# Patient Record
Sex: Male | Born: 1990
Health system: Southern US, Community
[De-identification: ages and names within clinical notes are randomized; demographics above are authoritative.]

## PROBLEM LIST (undated history)

## (undated) DIAGNOSIS — N200 Calculus of kidney: Secondary | ICD-10-CM

## (undated) DIAGNOSIS — F845 Asperger's syndrome: Secondary | ICD-10-CM

---

## 2010-02-27 ENCOUNTER — Emergency Department (HOSPITAL_BASED_OUTPATIENT_CLINIC_OR_DEPARTMENT_OTHER)
Admission: EM | Admit: 2010-02-27 | Discharge: 2010-02-27 | Payer: Self-pay | Source: Home / Self Care | Admitting: Emergency Medicine

## 2010-09-06 ENCOUNTER — Encounter: Payer: Self-pay | Admitting: *Deleted

## 2010-09-06 ENCOUNTER — Emergency Department (HOSPITAL_BASED_OUTPATIENT_CLINIC_OR_DEPARTMENT_OTHER)
Admission: EM | Admit: 2010-09-06 | Discharge: 2010-09-06 | Disposition: A | Discharge status: Home / Self Care | Payer: Medicaid Other | Attending: Emergency Medicine | Admitting: Emergency Medicine

## 2010-09-06 DIAGNOSIS — L0231 Cutaneous abscess of buttock: Secondary | ICD-10-CM | POA: Insufficient documentation

## 2010-09-06 DIAGNOSIS — L03317 Cellulitis of buttock: Secondary | ICD-10-CM

## 2010-09-06 DIAGNOSIS — R21 Rash and other nonspecific skin eruption: Secondary | ICD-10-CM | POA: Insufficient documentation

## 2010-09-06 MED ORDER — DOXYCYCLINE HYCLATE 100 MG PO CAPS: 100.0000 mg | ORAL_CAPSULE | Freq: Two times a day (BID) | ORAL | Status: AC

## 2010-09-06 NOTE — ED Provider Notes (Signed)
History     Chief Complaint  Patient presents with  . Recurrent Skin Infections   Patient is a 20 y.o. male presenting with rash.  Rash  This is a new problem. The current episode started more than 2 days ago. The problem has been gradually worsening. The problem is associated with nothing. There has been no fever. The rash is present on the right buttock and left buttock. The pain is moderate. The pain has been constant since onset. Associated symptoms include blisters, itching and pain. The treatment provided moderate relief.  Pt complains of pain and rash/sores to buttocks,  Pt complains of swelling and drainage  History reviewed. No pertinent past medical history.  History reviewed. No pertinent past surgical history.  History reviewed. No pertinent family history.  History  Substance Use Topics  . Smoking status: Never Smoker   . Smokeless tobacco: Not on file  . Alcohol Use: No      Review of Systems  Skin: Positive for itching and rash.  All other systems reviewed and are negative.    Physical Exam  BP 136/77  Pulse 76  Temp(Src) 98.2 F (36.8 C) (Oral)  Ht 5\' 9"  (1.753 m)  Wt 160 lb (72.576 kg)  BMI 23.63 kg/m2  SpO2 100%  Physical Exam  Constitutional: He appears well-developed and well-nourished.  HENT:  Head: Normocephalic.  Eyes: Pupils are equal, round, and reactive to light.  Neck: Normal range of motion.  Musculoskeletal: He exhibits tenderness.  Neurological: He is alert.  Skin: Rash noted. There is erythema.  Psychiatric: He has a normal mood and affect.    ED Course  Procedures  MDM Pt has 2 draining pustules bilat buttocks.        Langston Masker, [place] 09/06/10 2026

## 2010-09-06 NOTE — ED Notes (Signed)
C/o boils to lower back area x 1 week

## 2010-09-06 NOTE — ED Notes (Signed)
Pt presents to ED today with recurrent infections to back.  Pt has not seen/does not have PMD.

## 2010-09-11 NOTE — ED Provider Notes (Signed)
Medical screening examination/treatment/procedure(s) were performed by non-physician practitioner and as supervising physician I was immediately available for consultation/collaboration.   Vegas Fritze 09/11/10 0744 

## 2010-10-20 ENCOUNTER — Encounter (HOSPITAL_BASED_OUTPATIENT_CLINIC_OR_DEPARTMENT_OTHER): Payer: Self-pay | Admitting: Emergency Medicine

## 2010-10-20 ENCOUNTER — Emergency Department (HOSPITAL_BASED_OUTPATIENT_CLINIC_OR_DEPARTMENT_OTHER)
Admission: EM | Admit: 2010-10-20 | Discharge: 2010-10-20 | Disposition: A | Discharge status: Home / Self Care | Payer: Medicaid Other | Attending: Emergency Medicine | Admitting: Emergency Medicine

## 2010-10-20 DIAGNOSIS — R21 Rash and other nonspecific skin eruption: Secondary | ICD-10-CM | POA: Insufficient documentation

## 2010-10-20 DIAGNOSIS — L0231 Cutaneous abscess of buttock: Secondary | ICD-10-CM | POA: Insufficient documentation

## 2010-10-20 MED ORDER — DOXYCYCLINE HYCLATE 100 MG PO CAPS: 100.0000 mg | ORAL_CAPSULE | Freq: Two times a day (BID) | ORAL | Status: AC

## 2010-10-20 NOTE — ED Notes (Signed)
MD at bedside.Pt presents with raised red site to sacrum painful to touch

## 2010-10-20 NOTE — ED Provider Notes (Addendum)
History     CSN: [id number] Arrival date & time: 10/20/2010 12:24 PM  Chief Complaint  Patient presents with  . Recurrent Skin Infections    boil above rectum painful   HPI Comments: Patient complains of possible abscess versus cellulitis over his left upper gluteal region. He notes that he has had this before. He's been on doxycycline and it has improved. Patient has had no drainage from the wounds. Patient denies any fevers, nausea, vomiting. This is been slowly progressing over the last week. Does not have a primary care physician for followup.  He comes in because of increasing pain at the site and and concern that he needs antibiotics.  The history is provided by the patient. No language interpreter was used.    History reviewed. No pertinent past medical history.  History reviewed. No pertinent past surgical history.  No family history on file.  History  Substance Use Topics  . Smoking status: Never Smoker   . Smokeless tobacco: Not on file  . Alcohol Use: No      Review of Systems  Constitutional: Negative.  Negative for fever and chills.  HENT: Negative.   Eyes: Negative.  Negative for discharge and redness.  Respiratory: Negative.  Negative for cough and shortness of breath.   Cardiovascular: Negative.  Negative for chest pain.  Gastrointestinal: Negative.  Negative for nausea, vomiting and abdominal pain.  Genitourinary: Negative.  Negative for hematuria.  Musculoskeletal: Negative.  Negative for back pain.  Skin: Positive for color change and rash.  Neurological: Negative for syncope and headaches.  Hematological: Negative.  Negative for adenopathy.  Psychiatric/Behavioral: Negative.  Negative for confusion.  All other systems reviewed and are negative.    Physical Exam  BP 154/69  Pulse 69  Temp(Src) 98.1 F (36.7 C) (Oral)  Resp 12  Ht 5\' 9"  (1.753 m)  Wt 160 lb (72.576 kg)  BMI 23.63 kg/m2  SpO2 99%  Physical Exam  Constitutional: He is oriented  to person, place, and time. He appears well-developed and well-nourished. No distress.  HENT:  Head: Normocephalic and atraumatic.  Eyes: Conjunctivae and EOM are normal. Pupils are equal, round, and reactive to light.  Neck: Normal range of motion.  Pulmonary/Chest: Effort normal.  Abdominal: Soft.  Musculoskeletal: Normal range of motion.  Neurological: He is alert and oriented to person, place, and time.  Skin: Skin is warm and dry. Rash noted. He is not diaphoretic. There is erythema.       Over patient's left upper gluteal region he has an area of mild erythema and mild warmth. There is no fluctuance noted over the area. There are multiple dark scars noted from prior wounds. Minimal induration is noted over the site. The area is approximately 5-6 cm in diameter  Psychiatric: His behavior is normal. Judgment and thought content normal.    ED Course  Procedures  MDM As the patient has no fluctuance noted over the area I do not believe an incision and drainage is needed at this time. He does appear to have had a cellulitis to the site will place him back on doxycycline for antibiotic treatment. I will give him some clinic information for primary care followup as well. He knows to return if the symptoms are worsening including increasing redness swelling or fevers.   Date: 10/20/2010  Rate: 67  Rhythm: normal sinus rhythm  QRS Axis: normal  Intervals: normal  ST/T Wave abnormalities: nonspecific T wave changes  Conduction Disutrbances:none  Narrative Interpretation: Normal  EKG with no signs of acute ischemia  Old EKG Reviewed: unchanged from 09/24/2007      Nat Christen, MD 10/20/10 1245  Nat Christen, MD 10/20/10 1250

## 2010-10-20 NOTE — ED Notes (Signed)
Pt verbalizes benefits of antibiotics and follow up as needed

## 2010-10-20 NOTE — ED Notes (Signed)
Pt presents with raised red site above rectum

## 2011-07-12 ENCOUNTER — Emergency Department (HOSPITAL_BASED_OUTPATIENT_CLINIC_OR_DEPARTMENT_OTHER): Payer: Medicaid Other

## 2011-07-12 ENCOUNTER — Encounter (HOSPITAL_BASED_OUTPATIENT_CLINIC_OR_DEPARTMENT_OTHER): Payer: Self-pay | Admitting: *Deleted

## 2011-07-12 ENCOUNTER — Emergency Department (HOSPITAL_BASED_OUTPATIENT_CLINIC_OR_DEPARTMENT_OTHER)
Admission: EM | Admit: 2011-07-12 | Discharge: 2011-07-12 | Disposition: A | Discharge status: Home / Self Care | Payer: Medicaid Other | Attending: Emergency Medicine | Admitting: Emergency Medicine

## 2011-07-12 DIAGNOSIS — R319 Hematuria, unspecified: Secondary | ICD-10-CM | POA: Insufficient documentation

## 2011-07-12 DIAGNOSIS — N200 Calculus of kidney: Secondary | ICD-10-CM

## 2011-07-12 DIAGNOSIS — N201 Calculus of ureter: Secondary | ICD-10-CM | POA: Insufficient documentation

## 2011-07-12 DIAGNOSIS — F848 Other pervasive developmental disorders: Secondary | ICD-10-CM | POA: Insufficient documentation

## 2011-07-12 DIAGNOSIS — R350 Frequency of micturition: Secondary | ICD-10-CM | POA: Insufficient documentation

## 2011-07-12 HISTORY — DX: Asperger's syndrome: F84.5

## 2011-07-12 LAB — BASIC METABOLIC PANEL
BUN: 11 mg/dL (ref 6–23)
Calcium: 9.6 mg/dL (ref 8.4–10.5)
Creatinine, Ser: 1 mg/dL (ref 0.50–1.35)
GFR calc Af Amer: >90 mL/min (ref >90)
GFR calc non Af Amer: >90 mL/min (ref >90)
Glucose, Bld: 85 mg/dL (ref 70–99)
Potassium: 3.9 mEq/L (ref 3.5–5.1)

## 2011-07-12 LAB — CBC
HCT: 37.7 % — ABNORMAL LOW (ref 39.0–52.0)
Hemoglobin: 13.7 g/dL (ref 13.0–17.0)
MCH: 31.9 pg (ref 26.0–34.0)
MCHC: 36.3 g/dL — ABNORMAL HIGH (ref 30.0–36.0)
RDW: 12.4 % (ref 11.5–15.5)

## 2011-07-12 LAB — URINALYSIS, ROUTINE W REFLEX MICROSCOPIC
Bilirubin Urine: NEGATIVE
Glucose, UA: NEGATIVE mg/dL
Protein, ur: NEGATIVE mg/dL
Specific Gravity, Urine: 1.013 (ref 1.005–1.030)
Urobilinogen, UA: 1 mg/dL (ref 0.0–1.0)

## 2011-07-12 LAB — URINE MICROSCOPIC-ADD ON

## 2011-07-12 MED ORDER — TERAZOSIN HCL 1 MG PO CAPS: 1.0000 mg | ORAL_CAPSULE | Freq: Every day | ORAL | Status: AC

## 2011-07-12 MED ORDER — IBUPROFEN 600 MG PO TABS: 600.0000 mg | ORAL_TABLET | Freq: Three times a day (TID) | ORAL | Status: AC | PRN

## 2011-07-12 MED ORDER — HYDROCODONE-ACETAMINOPHEN 5-325 MG PO TABS: 1.0000 | ORAL_TABLET | Freq: Four times a day (QID) | ORAL | Status: AC | PRN

## 2011-07-12 NOTE — ED Provider Notes (Signed)
History     CSN: [id number]  Arrival date & time 07/12/11  1807   First MD Initiated Contact with Patient 07/12/11 1834      No chief complaint on file.   (Consider location/radiation/quality/duration/timing/severity/associated sxs/prior treatment) HPI The patient is a 21 yo man, history of asperger, presenting with hematuria.  The patient was in his usual state of health this morning, when he noticed a pink tinge to his urine.  Later that day, he urinated with no red or pink color to the urine.  He states that this transient color change to his urine has happened twice before, about 4 months ago and 12 months ago, both of which were self-limited.  He notes mild urinary frequency, using the bathroom 3-4 times/day for the last 2 days (which is more than his normal amount), but no dysuria, fevers, vomiting, flank pain, or groin pain.  He is not sexually active.  He takes no outpatient medications.    Past Medical History  Diagnosis Date  . Asperger syndrome     History reviewed. No pertinent past surgical history.  No family history on file.  History  Substance Use Topics  . Smoking status: Never Smoker   . Smokeless tobacco: Not on file  . Alcohol Use: No      Review of Systems General: no fevers, chills, changes in weight, changes in appetite Skin: no rash HEENT: no blurry vision, hearing changes, sore throat Pulm: no dyspnea, coughing, wheezing CV: no chest pain, palpitations, shortness of breath Abd: no abdominal pain, nausea/vomiting, diarrhea/constipation Ext: no arthralgias, myalgias Neuro: no weakness, numbness, or tingling  Allergies  Review of patient's allergies indicates no known allergies.  Home Medications  No current outpatient prescriptions on file.  BP 119/75  Pulse 64  Temp(Src) 97.3 F (36.3 C) (Oral)  Resp 20  SpO2 100%  Physical Exam General: alert, cooperative, and in no apparent distress HEENT: pupils equal round and reactive to light,  vision grossly intact, oropharynx clear and non-erythematous  Neck: supple, no lymphadenopathy Lungs: clear to ascultation bilaterally, normal work of respiration, no wheezes, rales, ronchi Heart: regular rate and rhythm, no murmurs, gallops, or rubs Abdomen: soft, non-tender, non-distended, normal bowel sounds Msk: no joint edema, warmth, or erythema Extremities:no cyanosis, clubbing, or edema Neurologic: alert & oriented X3, cranial nerves II-XII intact, strength grossly intact, sensation intact to light touch  ED Course  Procedures (including critical care time)  Labs Reviewed  URINALYSIS, ROUTINE W REFLEX MICROSCOPIC - Abnormal; Notable for the following:    Hgb urine dipstick LARGE (*)    All other components within normal limits  CBC - Abnormal; Notable for the following:    HCT 37.7 (*)    MCHC 36.3 (*)    All other components within normal limits  URINE MICROSCOPIC-ADD ON  URINE CULTURE  BASIC METABOLIC PANEL  PROTIME-INR   No results found.   No diagnosis found.    MDM   # Hematuria - the patient is a 21 yo man, presenting with asymptomatic hematuria, with reports of subjectively pink urine on 2 prior occasions in the past.  Given the patient's age, symptoms most likely represent nephrolithiasis.  No evidence of infection.  Very unlikely mass vs malignancy. -CT abd/pelvis -INR -cbc, bmet -if no etiology discovered with above work-up, will need outpatient urology evaluation   Addendum - CT showed nephrolithiasis.  Patient prescribed terazosin, norco, and motrin, and given strainer for urine.  Patient given contact info for Urology, asked to  follow-up.  Linward Headland, MD 07/12/11 2252

## 2011-07-12 NOTE — ED Notes (Signed)
Urinary frequency, urine is darker than normal. Pt is having a hard time giving history.

## 2011-07-12 NOTE — Discharge Instructions (Signed)
Kidney Stones Kidney stones (ureteral lithiasis) are deposits that form inside your kidneys. The intense pain is caused by the stone moving through the urinary tract. When the stone moves, the ureter goes into spasm around the stone. The stone is usually passed in the urine.  CAUSES   A disorder that makes certain neck glands produce too much parathyroid hormone (primary hyperparathyroidism).   A buildup of uric acid crystals.   Narrowing (stricture) of the ureter.   A kidney obstruction present at birth (congenital obstruction).   Previous surgery on the kidney or ureters.   Numerous kidney infections.  SYMPTOMS   Feeling sick to your stomach (nauseous).   Throwing up (vomiting).   Blood in the urine (hematuria).   Pain that usually spreads (radiates) to the groin.   Frequency or urgency of urination.  DIAGNOSIS   Taking a history and physical exam.   Blood or urine tests.   Computerized X-ray scan (CT scan).   Occasionally, an examination of the inside of the urinary bladder (cystoscopy) is performed.  TREATMENT   Observation.   Increasing your fluid intake.   Surgery may be needed if you have severe pain or persistent obstruction.  The size, location, and chemical composition are all important variables that will determine the proper choice of action for you. Talk to your caregiver to better understand your situation so that you will minimize the risk of injury to yourself and your kidney.  HOME CARE INSTRUCTIONS   Drink enough water and fluids to keep your urine clear or pale yellow.   Strain all urine through the provided strainer. Keep all particulate matter and stones for your caregiver to see. The stone causing the pain may be as small as a grain of salt. It is very important to use the strainer each and every time you pass your urine. The collection of your stone will allow your caregiver to analyze it and verify that a stone has actually passed.   Only take  over-the-counter or prescription medicines for pain, discomfort, or fever as directed by your caregiver.   Make a follow-up appointment with your caregiver as directed.   Get follow-up X-rays if required. The absence of pain does not always mean that the stone has passed. It may have only stopped moving. If the urine remains completely obstructed, it can cause loss of kidney function or even complete destruction of the kidney. It is your responsibility to make sure X-rays and follow-ups are completed. Ultrasounds of the kidney can show blockages and the status of the kidney. Ultrasounds are not associated with any radiation and can be performed easily in a matter of minutes.  SEEK IMMEDIATE MEDICAL CARE IF:   Pain cannot be controlled with the prescribed medicine.   You have a fever.   The severity or intensity of pain increases over 18 hours and is not relieved by pain medicine.   You develop a new onset of abdominal pain.   You feel faint or pass out.  MAKE SURE YOU:   Understand these instructions.   Will watch your condition.   Will get help right away if you are not doing well or get worse.  Document Released: 02/04/2005 Document Revised: 01/24/2011 Document Reviewed: 06/02/2009 [facility] Patient Information 2012 [place], [place].  Oxalate Restricted Diet A low-oxalate diet consists of foods that are low in a naturally occurring compound called oxalate that is found in plants.  INDICATIONS FOR USE Your caregiver may ask you to follow a low-oxalate  diet in order to reduce certain types of kidney stones.  GUIDELINES  Avoid high-oxalate foods listed below:  Grains: High-fiber or bran cereal, whole-wheat bread, grits, barley, buckwheat, graham crackers, amaranth, pretzels, and fruitcake.   Vegetables: Dried beans, wax beans, [place] fries, sweet potatoes, chives, dark leafy greens, eggplant, leaks, okra, parsley, rutabaga, tomato paste, watercress, and escarole.   Fruit: Dried  apricots, red currants, figs, kiwi, plums, and rhubarb.   Meat and Meat Substitutes: All nuts and nut butters, sesame seeds, and tahini paste. Soybeans and foods made from soy (soyburger, miso).   Milk: Chocolate milk and soymilk.   Fats and Oils: None to avoid.   Condiments/Miscellaneous: Chocolate, carob, marmalade, poppy seeds, instant iced tea, and juice from high-oxalate fruits.  Document Released: 06/01/2010 Document Revised: 01/24/2011 Document Reviewed: 06/01/2010 [facility] Patient Information 2012 [place], [place]. Oxalate Restricted Diet A low-oxalate diet consists of foods that are low in a naturally occurring compound called oxalate that is found in plants.  INDICATIONS FOR USE Your caregiver may ask you to follow a low-oxalate diet in order to reduce certain types of kidney stones.  GUIDELINES  Avoid high-oxalate foods listed below:  Grains: High-fiber or bran cereal, whole-wheat bread, grits, barley, buckwheat, graham crackers, amaranth, pretzels, and fruitcake.   Vegetables: Dried beans, wax beans, [place] fries, sweet potatoes, chives, dark leafy greens, eggplant, leaks, okra, parsley, rutabaga, tomato paste, watercress, and escarole.   Fruit: Dried apricots, red currants, figs, kiwi, plums, and rhubarb.   Meat and Meat Substitutes: All nuts and nut butters, sesame seeds, and tahini paste. Soybeans and foods made from soy (soyburger, miso).   Milk: Chocolate milk and soymilk.   Fats and Oils: None to avoid.   Condiments/Miscellaneous: Chocolate, carob, marmalade, poppy seeds, instant iced tea, and juice from high-oxalate fruits.  Document Released: 06/01/2010 Document Revised: 01/24/2011 Document Reviewed: 06/01/2010 [facility] Patient Information 2012 [place], [place].

## 2011-07-14 LAB — URINE CULTURE: Culture  Setup Time: 201305250204

## 2011-07-14 NOTE — ED Provider Notes (Signed)
I reviewed the resident's note and I agree with the findings and plan.      Nelia Shi, MD 07/14/11 [phone]

## 2011-12-27 ENCOUNTER — Emergency Department (HOSPITAL_BASED_OUTPATIENT_CLINIC_OR_DEPARTMENT_OTHER)
Admission: EM | Admit: 2011-12-27 | Discharge: 2011-12-27 | Disposition: A | Discharge status: Home / Self Care | Payer: Medicaid Other | Attending: Emergency Medicine | Admitting: Emergency Medicine

## 2011-12-27 ENCOUNTER — Emergency Department (HOSPITAL_BASED_OUTPATIENT_CLINIC_OR_DEPARTMENT_OTHER): Payer: Medicaid Other

## 2011-12-27 ENCOUNTER — Encounter (HOSPITAL_BASED_OUTPATIENT_CLINIC_OR_DEPARTMENT_OTHER): Payer: Self-pay | Admitting: *Deleted

## 2011-12-27 DIAGNOSIS — Z79899 Other long term (current) drug therapy: Secondary | ICD-10-CM | POA: Insufficient documentation

## 2011-12-27 DIAGNOSIS — N210 Calculus in bladder: Secondary | ICD-10-CM | POA: Insufficient documentation

## 2011-12-27 DIAGNOSIS — F848 Other pervasive developmental disorders: Secondary | ICD-10-CM | POA: Insufficient documentation

## 2011-12-27 DIAGNOSIS — R300 Dysuria: Secondary | ICD-10-CM | POA: Insufficient documentation

## 2011-12-27 LAB — BASIC METABOLIC PANEL
BUN: 12 mg/dL (ref 6–23)
Chloride: 101 mEq/L (ref 96–112)
GFR calc Af Amer: >90 mL/min (ref >90)
GFR calc non Af Amer: >90 mL/min (ref >90)
Potassium: 3.7 mEq/L (ref 3.5–5.1)
Sodium: 141 mEq/L (ref 135–145)

## 2011-12-27 LAB — URINE MICROSCOPIC-ADD ON

## 2011-12-27 LAB — URINALYSIS, ROUTINE W REFLEX MICROSCOPIC
Bilirubin Urine: NEGATIVE
Glucose, UA: NEGATIVE mg/dL
Ketones, ur: NEGATIVE mg/dL
Leukocytes, UA: NEGATIVE
Protein, ur: 30 mg/dL — AB
pH: 6 (ref 5.0–8.0)

## 2011-12-27 MED ORDER — OXYCODONE-ACETAMINOPHEN 5-325 MG PO TABS: 1.0000 | ORAL_TABLET | Freq: Four times a day (QID) | ORAL | Status: AC | PRN

## 2011-12-27 MED ORDER — ONDANSETRON HCL 4 MG PO TABS: 4.0000 mg | ORAL_TABLET | Freq: Four times a day (QID) | ORAL | Status: AC

## 2011-12-27 NOTE — ED Provider Notes (Signed)
History     CSN: [id number]  Arrival date & time 12/27/11  1248   First MD Initiated Contact with Patient 12/27/11 1316      Chief Complaint  Patient presents with  . Back Pain    (Consider location/radiation/quality/duration/timing/severity/associated sxs/prior treatment) HPI Pt presents with c/o pain in left flank and dysuria.  He states symptoms are worse at night and have been intermittent over the past several days.  No blood in urine.  He states this feels similar to when he has had kidney stones in the past.  No abdominal pain.  He has not tried anything for his symptoms.  Currently is pain free.  There are no other associated systemic symptoms, there are no other alleviating or modifying factors.   Past Medical History  Diagnosis Date  . Asperger syndrome     History reviewed. No pertinent past surgical history.  No family history on file.  History  Substance Use Topics  . Smoking status: Never Smoker   . Smokeless tobacco: Not on file  . Alcohol Use: No      Review of Systems ROS reviewed and all otherwise negative except for mentioned in HPI  Allergies  Review of patient's allergies indicates no known allergies.  Home Medications   Current Outpatient Rx  Name  Route  Sig  Dispense  Refill  . ONDANSETRON HCL 4 MG PO TABS   Oral   Take 1 tablet (4 mg total) by mouth every 6 (six) hours.   12 tablet   0   . OXYCODONE-ACETAMINOPHEN 5-325 MG PO TABS   Oral   Take 1-2 tablets by mouth every 6 (six) hours as needed for pain.   15 tablet   0   . TERAZOSIN HCL 1 MG PO CAPS   Oral   Take 1 capsule (1 mg total) by mouth at bedtime. Stop taking this medication after passage of stone.   30 capsule   0     BP 126/76  Pulse 85  Temp 98.8 F (37.1 C) (Oral)  Resp 20  SpO2 99% Vitals reviewed Physical Exam Physical Examination: General appearance - alert, well appearing, and in no distress Mental status - alert, oriented to person, place, and  time Eyes - no scleral icterus, no conjunctival injection Mouth - mucous membranes moist, pharynx normal without lesions Chest - clear to auscultation, no wheezes, rales or rhonchi, symmetric air entry Heart - normal rate, regular rhythm, normal S1, S2, no murmurs, rubs, clicks or gallops Abdomen - soft, nontender, nondistended, no masses or organomegaly Back exam - full range of motion, no CVA tenderness, no midline thoracic/lumbar tenderness Extremities - peripheral pulses normal, no pedal edema, no clubbing or cyanosis Skin - normal coloration and turgor, no rashes  ED Course  Procedures (including critical care time)  Labs Reviewed  URINALYSIS, ROUTINE W REFLEX MICROSCOPIC - Abnormal; Notable for the following:    Hgb urine dipstick SMALL (*)     Protein, ur 30 (*)     All other components within normal limits  URINE MICROSCOPIC-ADD ON - Abnormal; Notable for the following:    Bacteria, UA FEW (*)     All other components within normal limits  BASIC METABOLIC PANEL  URINE CULTURE   Ct Abdomen Pelvis Wo Contrast  12/27/2011  *RADIOLOGY REPORT*  Clinical Data:. Lower back pain, history of renal stones  CT ABDOMEN AND PELVIS WITHOUT CONTRAST  Technique:  Multidetector CT imaging of the abdomen and pelvis was performed following  the standard protocol without intravenous contrast.  Comparison: 07/12/2011  Findings: Sagittal images of the spine are unremarkable.  Lung bases are unremarkable.  Unenhanced liver shows no biliary ductal dilatation.  No calcified gallstones are noted within gallbladder.  No aortic aneurysm.  Moderate stool noted in the right colon.  No pericecal inflammation.  Normal appendix partially visualized in axial image 52.  At least four nonobstructive calcified calculi are noted within the right kidney the largest in lower pole measures 3.2 mm.  At least two nonobstructive calcified calculi are noted within left kidney the largest in mid pole measures 3 mm.  No proximal  or mid ureteral calcified calculi are identified.  No small bowel obstruction.  No ascites or free air.  No adenopathy.  Moderate stool noted in the rectosigmoid colon.  In axial image 67 there is a calcified calculus in the left posterior aspect of the urinary bladder measures 5 mm probable just beyond the left UVJ.  Nonspecific mild thickening of urinary bladder wall.  No distal ureteral distention.  No hydronephrosis.  Stool and gas noted within rectum.  No destructive bony lesions are noted within pelvis.  IMPRESSION:  1.  There is bilateral nonobstructive nephrolithiasis.  No hydronephrosis or hydroureter. 2.  No ureteral calculi are identified. 3.  There is a calcified calculus in the left posterior aspect of the urinary bladder probable just beyond the left UVJ, measures 5 mm.  Nonspecific mild thickening of urinary bladder wall. 4.  Moderate stool noted rectosigmoid colon.  Stool and gas noted within rectum.   Original Report Authenticated By: Natasha Mead, M.D.      1. Urinary bladder stone       MDM  Pt presenting with dysuria and left flank pain intermittently that feels similar to his prior stones.  Urine shows RBCs, CT scan shows stone in bladder.  Renal function is normal. Pt discharged with information for urology followup.  Given rx for pain meds, nausea meds- no meds required in ED.  Discharged with strict return precautions.  Pt agreeable with plan.        Ethelda Chick, MD 12/27/11 [phone]

## 2011-12-27 NOTE — ED Notes (Signed)
Back pain. States he has a history of kidney stones and feels like he has one.

## 2011-12-29 LAB — URINE CULTURE: Colony Count: NO GROWTH

## 2013-04-27 ENCOUNTER — Emergency Department (HOSPITAL_BASED_OUTPATIENT_CLINIC_OR_DEPARTMENT_OTHER)
Admission: EM | Admit: 2013-04-27 | Discharge: 2013-04-27 | Disposition: A | Discharge status: Home / Self Care | Payer: Medicaid Other | Attending: Emergency Medicine | Admitting: Emergency Medicine

## 2013-04-27 ENCOUNTER — Encounter (HOSPITAL_BASED_OUTPATIENT_CLINIC_OR_DEPARTMENT_OTHER): Payer: Self-pay | Admitting: Emergency Medicine

## 2013-04-27 DIAGNOSIS — Z8659 Personal history of other mental and behavioral disorders: Secondary | ICD-10-CM | POA: Insufficient documentation

## 2013-04-27 DIAGNOSIS — K625 Hemorrhage of anus and rectum: Secondary | ICD-10-CM

## 2013-04-27 DIAGNOSIS — Z87442 Personal history of urinary calculi: Secondary | ICD-10-CM | POA: Insufficient documentation

## 2013-04-27 HISTORY — DX: Calculus of kidney: N20.0

## 2013-04-27 LAB — OCCULT BLOOD X 1 CARD TO LAB, STOOL: Fecal Occult Bld: NEGATIVE

## 2013-04-27 NOTE — ED Provider Notes (Signed)
CSN: [id number]     Arrival date & time 04/27/13  1949 History  This chart was scribed for Dagmar Hait, MD by Blanchard Kelch, ED Scribe. The patient was seen in room MH11/MH11. Patient's care was started at 9:16 PM.    Chief Complaint  Patient presents with  . Rectal Bleeding      Patient is a 23 y.o. male presenting with hematochezia. The history is provided by the patient. No language interpreter was used.  Rectal Bleeding Duration:  3 days Timing:  Intermittent Chronicity:  New Context: spontaneously   Context: not diarrhea   Relieved by:  Nothing Associated symptoms: no abdominal pain, no dizziness, no fever and no vomiting   Risk factors: no anticoagulant use     HPI Comments: Oscar Clarke is a 23 y.o. male with a history of Asperger syndrome who presents to the Emergency Department complaining of intermittent hematochezia for the past three days. He is unsure if he had any episodes today. He states that the blood was on the stool and the tissue paper. He denies any recent strain or lifting of heavy items. He has been taking stool softener without relief. He denies abdominal pain, dysuria, hematuria, pain with BM, fever, vomiting, diarrhea, rash or dizziness. He is unsure if there is a family history of GI medical history. He denies taking any anticoagulants currently.   Past Medical History  Diagnosis Date  . Asperger syndrome   . Kidney stone    History reviewed. No pertinent past surgical history. No family history on file. History  Substance Use Topics  . Smoking status: Never Smoker   . Smokeless tobacco: Not on file  . Alcohol Use: Yes    Review of Systems  Constitutional: Negative for fever.  Gastrointestinal: Positive for hematochezia. Negative for vomiting, abdominal pain and diarrhea.  Genitourinary: Negative for dysuria and hematuria.  Skin: Negative for rash.  Neurological: Negative for dizziness.  All other systems reviewed and are  negative.      Allergies  Review of patient's allergies indicates no known allergies.  Home Medications   Current Outpatient Rx  Name  Route  Sig  Dispense  Refill  . IBUPROFEN IB PO   Oral   Take by mouth.         . ondansetron (ZOFRAN) 4 MG tablet   Oral   Take 1 tablet (4 mg total) by mouth every 6 (six) hours.   12 tablet   0   . oxyCODONE-acetaminophen (PERCOCET/ROXICET) 5-325 MG per tablet   Oral   Take 1-2 tablets by mouth every 6 (six) hours as needed for pain.   15 tablet   0   . EXPIRED: terazosin (HYTRIN) 1 MG capsule   Oral   Take 1 capsule (1 mg total) by mouth at bedtime. Stop taking this medication after passage of stone.   30 capsule   0    Triage Vitals: BP 140/82  Pulse 84  Temp(Src) 98.4 F (36.9 C) (Oral)  Resp 20  Ht 5\' 9"  (1.753 m)  Wt 150 lb (68.04 kg)  BMI 22.14 kg/m2  SpO2 100%  Physical Exam  Nursing note and vitals reviewed. Constitutional: He is oriented to person, place, and time. He appears well-developed and well-nourished. No distress.  HENT:  Head: Normocephalic and atraumatic.  Eyes: EOM are normal.  Neck: Neck supple. No tracheal deviation present.  Cardiovascular: Normal rate, regular rhythm and normal heart sounds.  Exam reveals no gallop and no friction  rub.   No murmur heard. Pulmonary/Chest: Effort normal and breath sounds normal. No respiratory distress. He has no wheezes. He has no rales.  Abdominal: Soft. Bowel sounds are normal. He exhibits no distension. There is no tenderness.  Genitourinary: Rectal exam shows no external hemorrhoid, no internal hemorrhoid, no fissure, no mass, no tenderness and anal tone normal.  No gross blood present.  Musculoskeletal: Normal range of motion.  Neurological: He is alert and oriented to person, place, and time.  Skin: Skin is warm and dry.  Psychiatric: He has a normal mood and affect. His behavior is normal.    ED Course  Procedures (including critical care  time)  DIAGNOSTIC STUDIES: Oxygen Saturation is 100% on room air, normal by my interpretation.    COORDINATION OF CARE: 9:23 PM -Will order Heme Occult lab. Patient verbalizes understanding and agrees with treatment plan.    Labs Review Labs Reviewed - No data to display Imaging Review No results found.   EKG Interpretation None      MDM   Final diagnoses:  Rectal bleeding    32M here with intermittent blood in stools. No pain with defecation, no diarrhea, no recent travel, no persistent bloody diarrhea. Normal vitals here. Normal exam with no gross blood on rectal exam, normal hemoccult. Belly exam benign. Instructed to establish PCP and f/u if bloody stools continue.  I personally performed the services described in this documentation, which was scribed in my presence. The recorded information has been reviewed and is accurate.     Dagmar HaitWilliam Caretha Rumbaugh, MD 04/27/13 [phone]

## 2013-04-27 NOTE — Discharge Instructions (Signed)
Bloody Stools °Bloody stools often mean that there is a problem in the digestive tract. Your caregiver may use the term "melena" to describe black, tarry, and bad smelling stools or "hematochezia" to describe red or maroon-colored stools. Blood seen in the stool can be caused by bleeding anywhere along the intestinal tract.  °A black stool usually means that blood is coming from the upper part of the gastrointestinal tract (esophagus, stomach, or small bowel). Passing maroon-colored stools or bright red blood usually means that blood is coming from lower down in the large bowel or the rectum. However, sometimes massive bleeding in the stomach or small intestine can cause bright red bloody stools.  °Consuming black licorice, lead, iron pills, medicines containing bismuth subsalicylate, or blueberries can also cause black stools. Your caregiver can test black stools to see if blood is present. °It is important that the cause of the bleeding be found. Treatment can then be started, and the problem can be corrected. Rectal bleeding may not be serious, but you should not assume everything is okay until you know the cause. It is very important to follow up with your caregiver or a specialist in gastrointestinal problems. °CAUSES  °Blood in the stools can come from various underlying causes. Often, the cause is not found during your first visit. Testing is often needed to discover the cause of bleeding in the gastrointestinal tract. Causes range from simple to serious or even life-threatening. Possible causes include: °· Hemorrhoids. These are veins that are full of blood (engorged) in the rectum. They cause pain, inflammation, and may bleed. °· Anal fissures. These are areas of painful tearing which may bleed. They are often caused by passing hard stool. °· Diverticulosis. These are pouches that form on the colon over time, with age, and may bleed significantly. °· Diverticulitis. This is inflammation in areas with  diverticulosis. It can cause pain, fever, and bloody stools, although bleeding is rare. °· Proctitis and colitis. These are inflamed areas of the rectum or colon. They may cause pain, fever, and bloody stools. °· Polyps and cancer. Colon cancer is a leading cause of preventable cancer death. It often starts out as precancerous polyps that can be removed during a colonoscopy, preventing progression into cancer. Sometimes, polyps and cancer may cause rectal bleeding. °· Gastritis and ulcers. Bleeding from the upper gastrointestinal tract (near the stomach) may travel through the intestines and produce black, sometimes tarry, often bad smelling stools. In certain cases, if the bleeding is fast enough, the stools may not be black, but red and the condition may be life-threatening. °SYMPTOMS  °You may have stools that are bright red and bloody, that are normal color with blood on them, or that are dark black and tarry. In some cases, you may only have blood in the toilet bowl. Any of these cases need medical care. You may also have: °· Pain at the anus or anywhere in the rectum. °· Lightheadedness or feeling faint. °· Extreme weakness. °· Nausea or vomiting. °· Fever. °DIAGNOSIS °Your caregiver may use the following methods to find the cause of your bleeding: °· Taking a medical history. Age is important. Older people tend to develop polyps and cancer more often. If there is anal pain and a hard, large stool associated with bleeding, a tear of the anus may be the cause. If blood drips into the toilet after a bowel movement, bleeding hemorrhoids may be the problem. The color and frequency of the bleeding are additional considerations. In most cases, the medical history provides clues, but seldom the final   answer. °· A visual and finger (digital) exam. Your caregiver will inspect the anal area, looking for tears and hemorrhoids. A finger exam can provide information when there is tenderness or a growth inside. In men, the  prostate is also examined. °· Endoscopy. Several types of small, long scopes (endoscopes) are used to view the colon. °· In the office, your caregiver may use a rigid, or more commonly, a flexible viewing sigmoidoscope. This exam is called flexible sigmoidoscopy. It is performed in 5 to 10 minutes. °· A more thorough exam is accomplished with a colonoscope. It allows your caregiver to view the entire 5 to 6 foot long colon. Medicine to help you relax (sedative) is usually given for this exam. Frequently, a bleeding lesion may be present beyond the reach of the sigmoidoscope. So, a colonoscopy may be the best exam to start with. Both exams are usually done on an outpatient basis. This means the patient does not stay overnight in the hospital or surgery center. °· An upper endoscopy may be needed to examine your stomach. Sedation is used and a flexible endoscope is put in your mouth, down to your stomach. °· A barium enema X-ray. This is an X-ray exam. It uses liquid barium inserted by enema into the rectum. This test alone may not identify an actual bleeding point. X-rays highlight abnormal shadows, such as those made by lumps (tumors), diverticuli, or colitis. °TREATMENT  °Treatment depends on the cause of your bleeding.  °· For bleeding from the stomach or colon, the caregiver doing your endoscopy or colonoscopy may be able to stop the bleeding as part of the procedure. °· Inflammation or infection of the colon can be treated with medicines. °· Many rectal problems can be treated with creams, suppositories, or warm baths. °· Surgery is sometimes needed. °· Blood transfusions are sometimes needed if you have lost a lot of blood. °· For any bleeding problem, let your caregiver know if you take aspirin or other blood thinners regularly. °HOME CARE INSTRUCTIONS  °· Take any medicines exactly as prescribed. °· Keep your stools soft by eating a diet high in fiber. Prunes (1 to 3 a day) work well for many people. °· Drink  enough water and fluids to keep your urine clear or pale yellow. °· Take sitz baths if advised. A sitz bath is when you sit in a bathtub with warm water for 10 to 15 minutes to soak, soothe, and cleanse the rectal area. °· If enemas or suppositories are advised, be sure you know how to use them. Tell your caregiver if you have problems with this. °· Monitor your bowel movements to look for signs of improvement or worsening. °SEEK MEDICAL CARE IF:  °· You do not improve in the time expected. °· Your condition worsens after initial improvement. °· You develop any new symptoms. °SEEK IMMEDIATE MEDICAL CARE IF:  °· You develop severe or prolonged rectal bleeding. °· You vomit blood. °· You feel weak or faint. °· You have a fever. °MAKE SURE YOU: °· Understand these instructions. °· Will watch your condition. °· Will get help right away if you are not doing well or get worse. °Document Released: 01/25/2002 Document Revised: 04/29/2011 Document Reviewed: 06/22/2010 °ExitCare® Patient Information ©2014 [employer]. ° ° °Emergency Department Resource Guide °1) Find a Doctor and Pay Out of Pocket °Although you won't have to find out who is covered by your insurance plan, it is a good idea to ask around and get recommendations. You will   then need to call the office and see if the doctor you have chosen will accept you as a new patient and what types of options they offer for patients who are self-pay. Some doctors offer discounts or will set up payment plans for their patients who do not have insurance, but you will need to ask so you aren't surprised when you get to your appointment. ° °2) Contact Your Local Health Department °Not all health departments have doctors that can see patients for sick visits, but many do, so it is worth a call to see if yours does. If you don't know where your local health department is, you can check in your phone book. The CDC also has a tool to help you locate your state's health department,  and many state websites also have listings of all of their local health departments. ° °3) Find a Walk-in Clinic °If your illness is not likely to be very severe or complicated, you may want to try a walk in clinic. These are popping up all over the country in pharmacies, drugstores, and shopping centers. They're usually staffed by nurse practitioners or physician assistants that have been trained to treat common illnesses and complaints. They're usually fairly quick and inexpensive. However, if you have serious medical issues or chronic medical problems, these are probably not your best option. ° °No Primary Care Doctor: °- Call [facility] at  832-8000 - they can help you locate a primary care doctor that  accepts your insurance, provides certain services, etc. °- Physician Referral Service- 1-800-533-3463 ° °Chronic Pain Problems: °Organization         Address  Phone   Notes  °[facility]  (336) 297-2271 Patients need to be referred by their primary care doctor.  ° °Medication Assistance: °Organization         Address  Phone   Notes  °[place] [facility] [address] °[place], [place] [address] (336) 641-8030 --Must be a resident of [place] °-- Must have NO insurance coverage whatsoever (no Medicaid/ Medicare, etc.) °-- The pt. MUST have a primary care doctor that directs their care regularly and follows them in the community °  °[facility]  (866) 331-1348   °[employer]  (888) 892-1162   ° °Agencies that provide inexpensive medical care: °Organization         Address  Phone   Notes  °[facility]  (336) 832-8035   °[facility]    (336) 832-7272   °[facility] [address] °[place], [place] [address] (336) 832-4777   °[facility] [address], °[place] (336) 271-4999   °[facility]    (336) 373-0678   °[facility]    (336) 272-1050   °[facility] ° [address], [place] Phone:  (336) 832-4444, Fax:  (336) 832-4440 Hours of Operation:  9 am - 6 pm, M-F.  Also accepts Medicaid/Medicare and self-pay.  °[facility] ° [address], [place] Phone: (336) [phone], Fax: (336) [phone]. Hours of Operation:  8:30 am - 5:30 pm, M-F.  Also accepts Medicaid and self-pay.  °[facility] [place] [address], [place] Phone: (336) 878-6027   °[facility] [address], [place],  (336)723-1848, Ext. 123 Mondays & Thursdays: 7-9 AM.  First 15 patients are seen on a first come, first serve   basis. °  ° °Medicaid-accepting [place] Providers: ° °Organization         Address  Phone   Notes  °[facility] [address], [place] (336) 641-2100 Also accepts self-pay patients.  °[facility] [address], [place] ° (336) 856-9996   °[facility] [address], [place] (336) 288-8857   °[facility] [address] [place] [address], [place] (336) 299-7000   °Veita Bland [address], [place]  ° (336) 373-1557 Only accepts [place] Access Medicaid patients after they have their name applied to their card.  ° °Self-Pay (no insurance) in [place]: ° °Organization         Address  Phone   Notes  °Sickle Cell Patients, [facility] [address], [place] (336) 832-1970   °[facility] [address], [place] (336) 832-4400   °[facility] ° [address], [place] (336) 992-4800   °[facility]/Dr. Osei-Bonsu ° [address] [place] [address], [place] or [address], [place] (336) [phone] Phone number for both [place] and [place] locations is the same.  °[facility] [address], [place] (336) 299-0000   °[facility]  [place] [address] [place] [address], [place] or [address] (336) 852-7530 °(336) 878-2260   °[facility] [address], [place] (336) 350-1642, phone; (336) 294-5005, fax Sees patients 1st and 3rd Saturday of every month.  Must not qualify for public or private insurance (i.e. Medicaid, Medicare, [place] Health Choice, Veterans' Benefits) • Household income should be no more than 200% of the poverty level •The clinic cannot treat you if you are pregnant or think you are pregnant • Sexually transmitted diseases are not treated at the clinic.  ° ° °Dental Care: °Organization         Address  Phone  Notes  °[facility] [address], [place] (336) 641-6152 Accepts children up to age 21 who are enrolled in Medicaid or [place] Health Choice; pregnant women with a Medicaid card; and children who have applied for Medicaid or [place] Health Choice, but were declined, whose parents can pay a reduced fee at time of service.  °[facility] [place]  [address], [place] (336) 641-7733 Accepts children up to age 21 who are enrolled in Medicaid or [place] Health Choice; pregnant women with a Medicaid card; and children who have applied for Medicaid or [place] Health Choice, but were declined, whose parents can pay a reduced fee at time of service.  °[facility] ° [address],  (336) 641-4533 Patients are seen by appointment only. Walk-ins are not accepted. [facility] will see patients 18 years of age and older. °Monday - Tuesday (8am-5pm) °Most Wednesdays (8:30-5pm) °$30 per visit, cash only  °[facility] ° [address], [place] (336) 641-4533 Patients are seen by appointment only. Walk-ins are not accepted. [facility] will see patients 18 years of age and older. °One Wednesday Evening (Monthly: Volunteer Based).  $30 per visit,  cash only  °[facility]  (919) 537-3737 for adults; Children under age 4, call Graduate Pediatric Dentistry at ([phone]) [phone]. Children aged 4-14, please call (919)   [phone] to request a pediatric application. ° Dental services are provided in all areas of dental care including fillings, crowns and bridges, complete and partial dentures, implants, gum treatment, root canals, and extractions. Preventive care is also provided. Treatment is provided to both adults and children. °Patients are selected via a lottery and there is often a waiting list. °  °[facility] [address], °[place] ° (336) 763-8833 [email or web address] °  °[facility] [address], [place], [place] (336)723-1848, Ext. 123 Second and Fourth Thursday of each month, opens at 6:30 AM; Clinic ends at 9 AM.  Patients are seen on a first-come first-served basis, and a limited number are seen during each clinic.  ° °[facility] ° [address], [place], [place] (336) 723-7904   Eligibility Requirements °You must have lived in [place], [place], or [place] counties for at least the last three months. °  You cannot be eligible for state or federal sponsored healthcare insurance, including Veterans Administration, Medicaid, or Medicare. °  You generally cannot be eligible for healthcare insurance through your employer.  °  How to apply: °Eligibility screenings are held every Tuesday and Wednesday afternoon from 1:00 pm until 4:00 pm. You do not need an appointment for the interview!  °[facility] [address], [place], [place] 336-631-2330   °[facility]  336-342-8273   °[facility]  336-703-3100   °[facility]  336-570-6415   ° °Behavioral Health Resources in the Community: °Intensive Outpatient Programs °Organization         Address  Phone  Notes  °[facility] [address],  [place], [place] 336-878-6098   °[facility] [address], [place], [place] 336-832-9800   °[facility] [address], [place], [place] ° 336-882-2125   °[facility] [address],  °[place], [place] 1-800-853-5163 or 336-641-4981   °Substance Abuse Resources °Organization         Address  Phone  Notes  °[facility]  336-882-2125   °[facility]  336-784-9470   °[facility]  336-285-9073   °[facility]  336-845-3988   °Residential & Outpatient Substance Abuse Program  1-800-659-3381   °Psychological Services °Organization         Address  Phone  Notes  °[facility]  336- 832-9600   °[facility]  336- 378-7881   °[facility] [address], [place] 1-800-853-5163 or 336-641-4981   ° °Mobile Crisis Teams °Organization         Address  Phone  Notes  °[facility], Mobile Crisis Care Unit  1-877-626-1772   °Assertive °[facility] ° [address] [place], [place] 336-834-9664   °Sharon DeEsch [address] °[place] 336-554-5454   ° °Self-Help/Support Groups °Organization         Address  Phone             Notes  °[facility]  - variety of support groups  336- 373-1402 Call for more information  °Narcotics Anonymous (NA), [facility] [address], °[place]  2 meetings at this location  ° °Residential Treatment Programs °Organization         Address  Phone  Notes  °[facility] [address],    °   1-866-801-8205   °[facility] ° [address], [place], [place] 704-293-8524   °[facility] [address], [place] 336-845-3988 Admissions: 8am-3pm M-F  °[facility] [address]    °[place], [place] 336-841-1104   °[facility] [address], [place], [place] 336-379-7146   °[facility] [address]  °[place], [place] 336-285-9073   °[facility] Intensive Outpatient [address], [place], [place] 336-852-3033   °[facility] [address]  °[place], [place] 1-877-615-2722 or 336-784-9470   °[facility] [address] [place], [place] 336-227-7417 Accepts Medicaid  °[facility] [address]  °[place] 1-800-659-3381 Substance Abuse/Addiction Treatment  ° °[place] Behavioral Health Resources °Organization         Address  Phone  Notes  °[facility]  (888) 581-9988   °Julie Brannon, PhD [address] [place], [place]   (336) 349-5553 or (336) 951-0000   °[facility]   [address] °[place], [place] (336) 349-4454   °[facility] [address], [place], [place] (336) 342-8316 Insurance/Medicaid/sponsorship through [facility]  °[facility] [address]                                    [place], [place] (336) 342-8316 Therapy/tele-psych/case  °[facility] [address]  ° [place], [place] (336) 349-2233    °Dr. Arfeen  (336) 349-4544   °[facility] [place]  [employer] [place] [facility] 1) [address], [place] °2) [address], [place] °3)  [address], [place] (336) 349-3220 °(336) 342-7768 ° °(336) 342-8140   °[place] [facility] (336) 342-1394 or (336) 342-3537 (After Hours)    ° ° ° °

## 2013-04-27 NOTE — ED Notes (Signed)
C/o rectal bleeding/bloody stools x 2-3 days

## 2014-06-05 IMAGING — CT CT ABD-PELV W/O CM
2 of 4 series · 16 of 46 positions shown, 18 images · non-contrast
Comparison: 07/12/2011

CLINICAL DATA: . Lower back pain, history of renal stones

CT ABDOMEN AND PELVIS WITHOUT CONTRAST
TECHNIQUE: Multidetector CT imaging of the abdomen and pelvis was
performed following the standard protocol without intravenous
contrast.

[Series 3: renal stone < 200 lbs 5.0 b31f · axial · 0.71mm/px · z∈[-720,-340]mm · 13 of 84 slices shown, 15 images]
[im 4/84  soft-tissue]
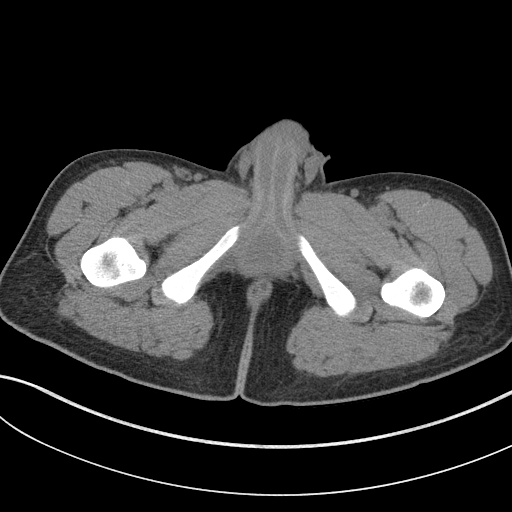
[im 4/84  bone]
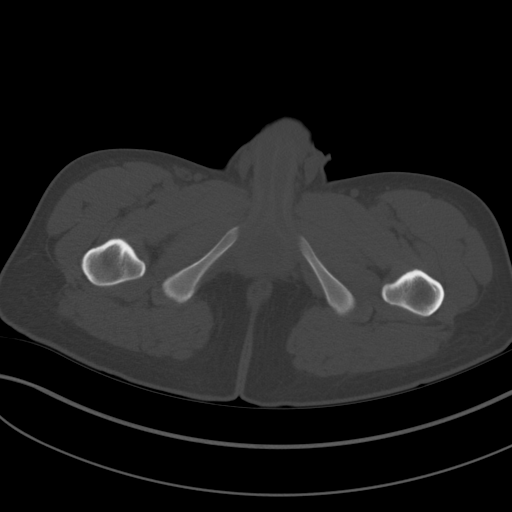
[im 10/84  soft-tissue]
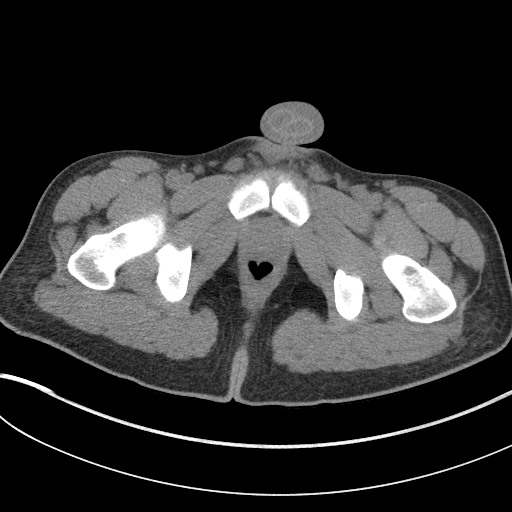
[im 17/84  soft-tissue]
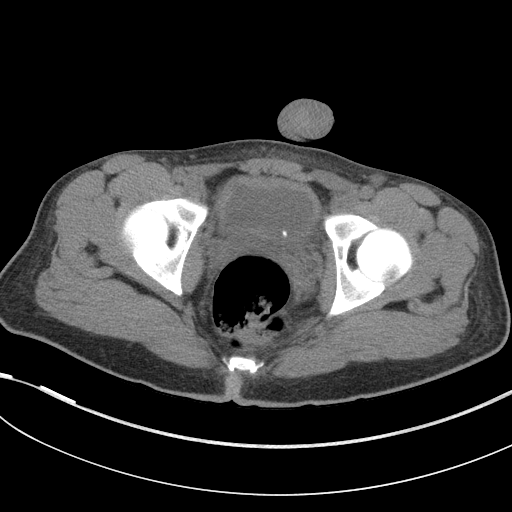
[im 24/84  soft-tissue]
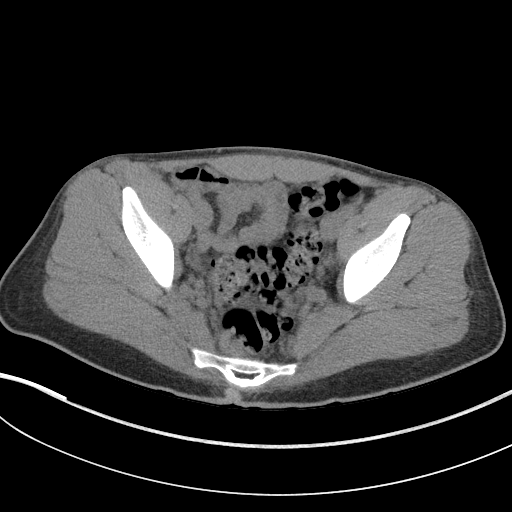
[im 30/84  soft-tissue]
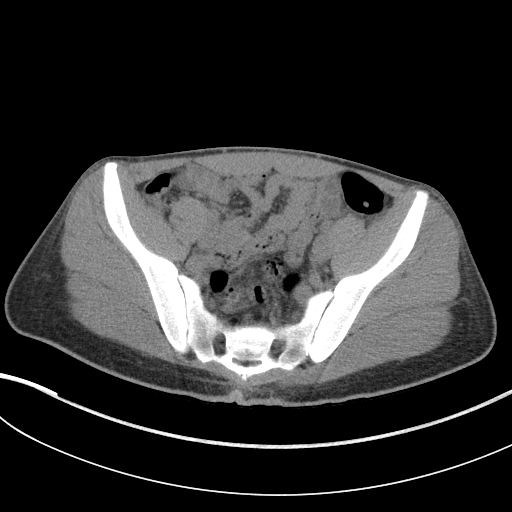
[im 37/84  soft-tissue]
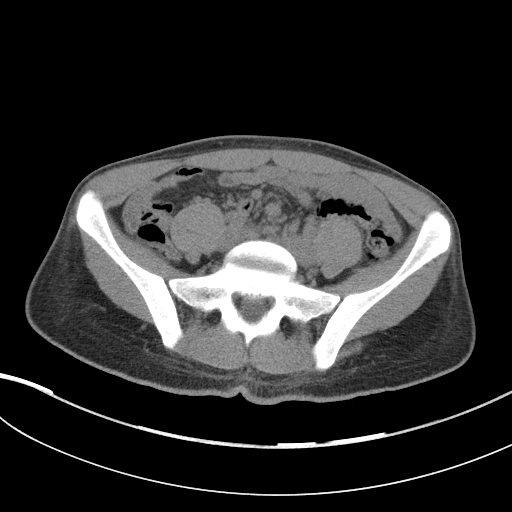
[im 44/84  soft-tissue]
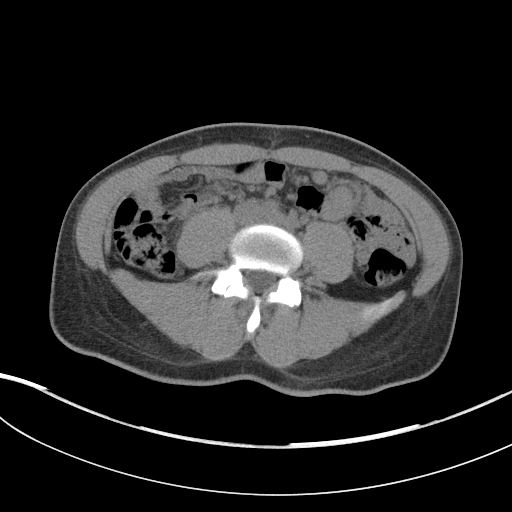
[im 47/84  soft-tissue]
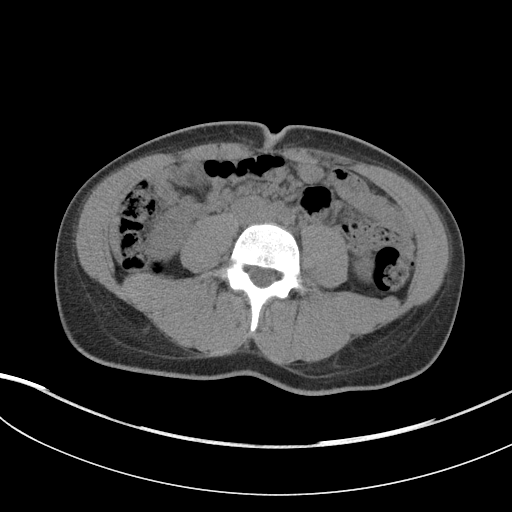
[im 54/84  soft-tissue]
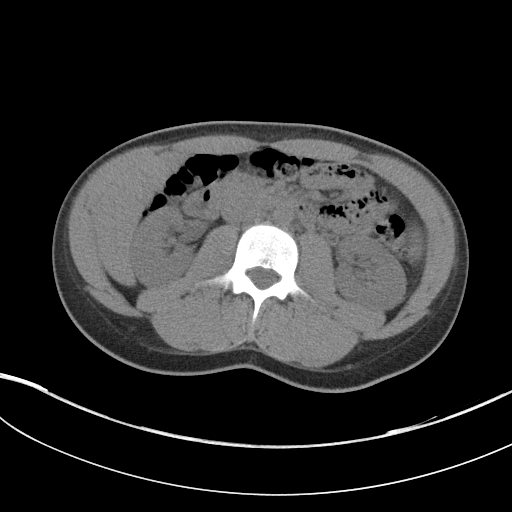
[im 54/84  bone]
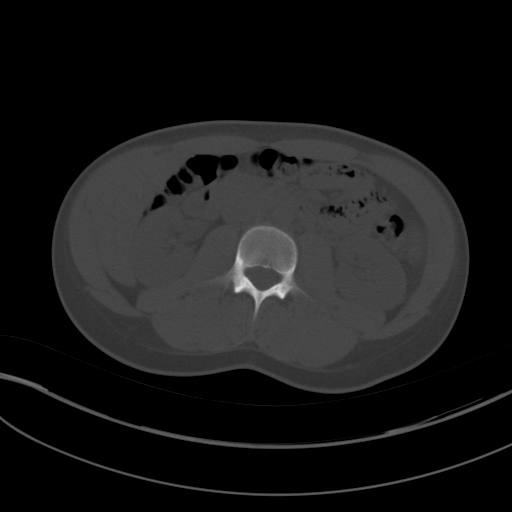
[im 60/84  soft-tissue]
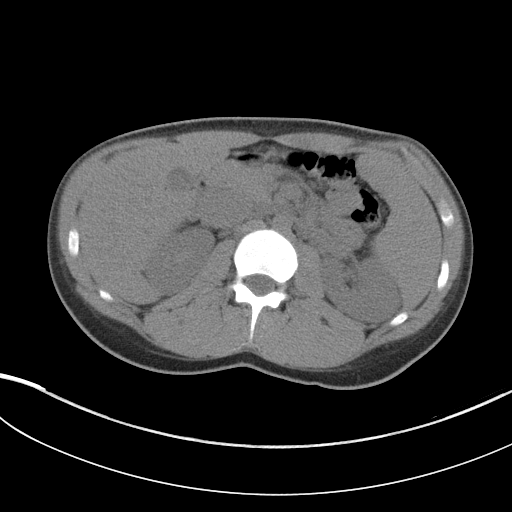
[im 67/84  soft-tissue]
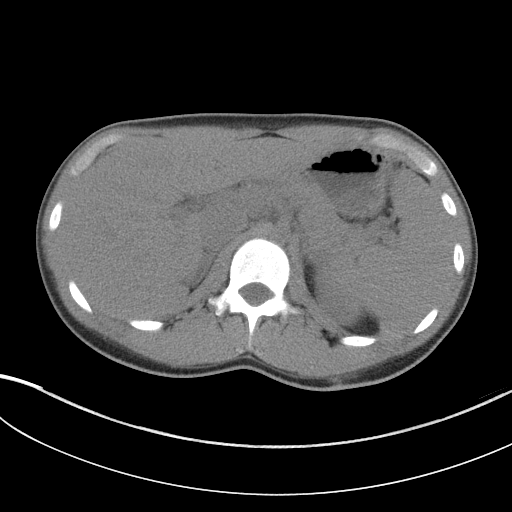
[im 74/84  soft-tissue]
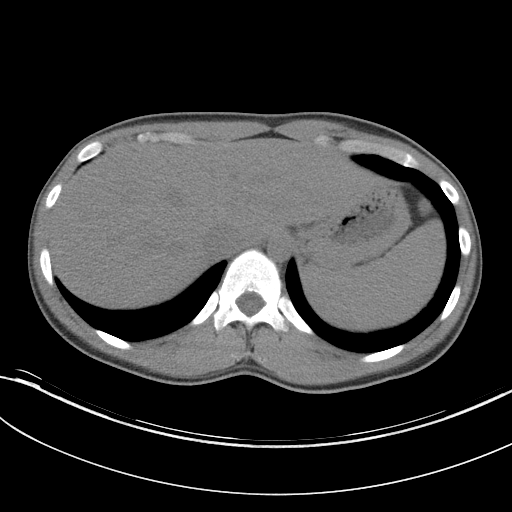
[im 80/84  soft-tissue]
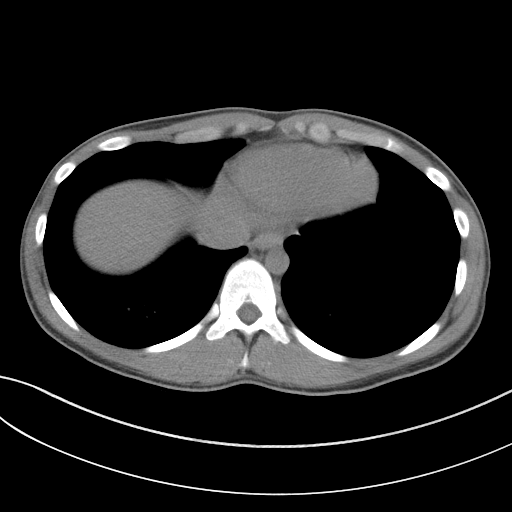

[Series 6: renal stone 3.0 coronal · coronal · 0.99mm/px · 3 of 65 slices shown]
[im 22/65  soft-tissue]
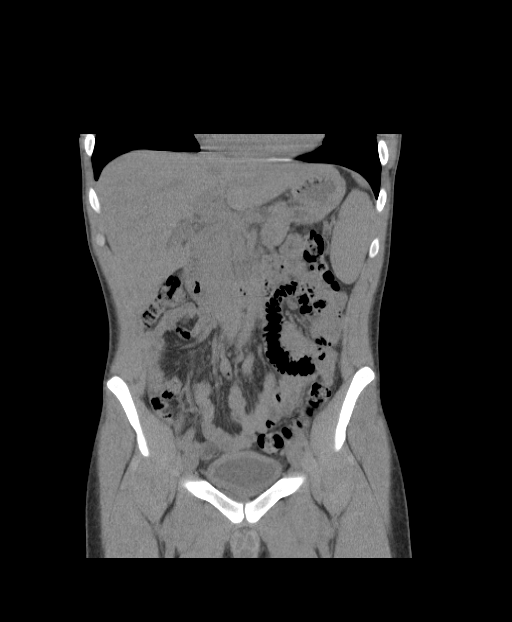
[im 29/65  soft-tissue]
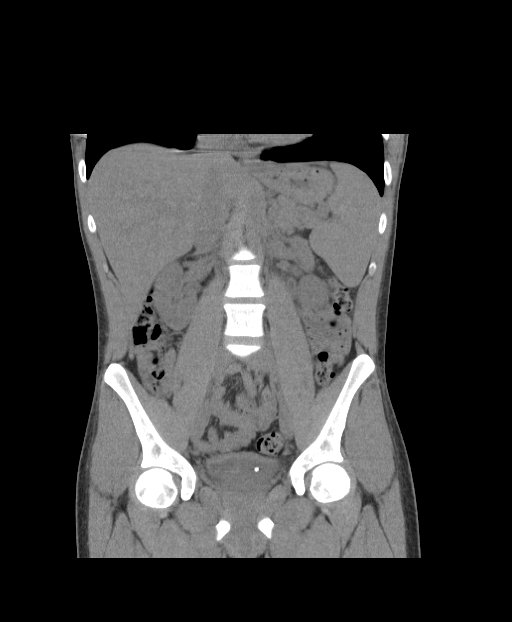
[im 36/65  soft-tissue]
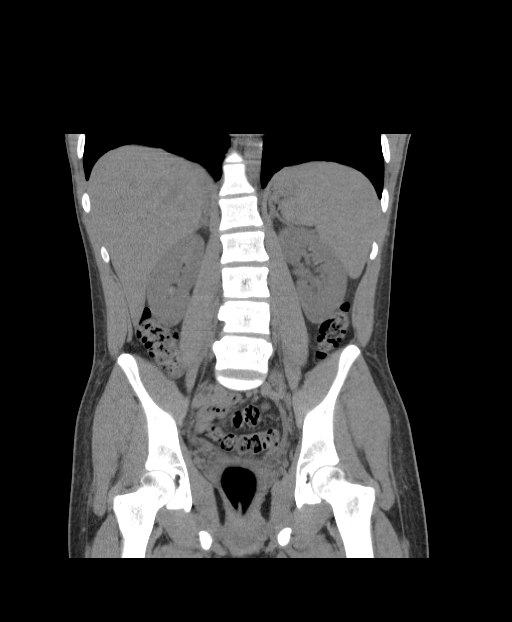

[16 of 46 positions shown; findings below may reference images not displayed]

FINDINGS: Sagittal images of the spine are unremarkable.  Lung
bases are unremarkable.

Unenhanced liver shows no biliary ductal dilatation.  No calcified
gallstones are noted within gallbladder.

No aortic aneurysm.  Moderate stool noted in the right colon.  No
pericecal inflammation.  Normal appendix partially visualized in
axial image 52.

At least four nonobstructive calcified calculi are noted within the
right kidney the largest in lower pole measures 3.2 mm.  At least
two nonobstructive calcified calculi are noted within left kidney
the largest in mid pole measures 3 mm.  No proximal or mid ureteral
calcified calculi are identified.

No small bowel obstruction.  No ascites or free air.  No
adenopathy.  Moderate stool noted in the rectosigmoid colon.

In axial image 67 there is a calcified calculus in the left
posterior aspect of the urinary bladder measures 5 mm probable just
beyond the left UVJ.

Nonspecific mild thickening of urinary bladder wall.  No distal
ureteral distention.  No hydronephrosis.

Stool and gas noted within rectum.  No destructive bony lesions are
noted within pelvis.
IMPRESSION: 1.  There is bilateral nonobstructive nephrolithiasis.  No
hydronephrosis or hydroureter.
2.  No ureteral calculi are identified.
3.  There is a calcified calculus in the left posterior aspect of
the urinary bladder probable just beyond the left UVJ, measures 5
mm..  Nonspecific mild thickening of urinary bladder wall.
4.  Moderate stool noted rectosigmoid colon.  Stool and gas noted
within rectum.

## 2016-03-27 ENCOUNTER — Emergency Department (HOSPITAL_BASED_OUTPATIENT_CLINIC_OR_DEPARTMENT_OTHER)
Admission: EM | Admit: 2016-03-27 | Discharge: 2016-03-27 | Disposition: A | Discharge status: Home / Self Care | Payer: 59 | Attending: Emergency Medicine | Admitting: Emergency Medicine

## 2016-03-27 ENCOUNTER — Emergency Department (HOSPITAL_BASED_OUTPATIENT_CLINIC_OR_DEPARTMENT_OTHER): Payer: 59

## 2016-03-27 ENCOUNTER — Encounter (HOSPITAL_BASED_OUTPATIENT_CLINIC_OR_DEPARTMENT_OTHER): Payer: Self-pay

## 2016-03-27 DIAGNOSIS — R109 Unspecified abdominal pain: Secondary | ICD-10-CM

## 2016-03-27 DIAGNOSIS — R1084 Generalized abdominal pain: Secondary | ICD-10-CM | POA: Diagnosis not present

## 2016-03-27 LAB — COMPREHENSIVE METABOLIC PANEL
ALT: 19 U/L (ref 17–63)
ANION GAP: 6 (ref 5–15)
AST: 18 U/L (ref 15–41)
Albumin: 4.6 g/dL (ref 3.5–5.0)
Alkaline Phosphatase: 50 U/L (ref 38–126)
BUN: 12 mg/dL (ref 6–20)
CHLORIDE: 105 mmol/L (ref 101–111)
CO2: 30 mmol/L (ref 22–32)
CREATININE: 0.78 mg/dL (ref 0.61–1.24)
Calcium: 9.3 mg/dL (ref 8.9–10.3)
GFR calc non Af Amer: >60 mL/min (ref >60)
GLUCOSE: 90 mg/dL (ref 65–99)
Potassium: 3.9 mmol/L (ref 3.5–5.1)
SODIUM: 141 mmol/L (ref 135–145)
Total Bilirubin: 1.3 mg/dL — ABNORMAL HIGH (ref 0.3–1.2)
Total Protein: 7 g/dL (ref 6.5–8.1)

## 2016-03-27 LAB — URINALYSIS, ROUTINE W REFLEX MICROSCOPIC
Bilirubin Urine: NEGATIVE
Glucose, UA: NEGATIVE mg/dL
Hgb urine dipstick: NEGATIVE
Ketones, ur: NEGATIVE mg/dL
Leukocytes, UA: NEGATIVE
NITRITE: NEGATIVE
Protein, ur: NEGATIVE mg/dL
SPECIFIC GRAVITY, URINE: 1.011 (ref 1.005–1.030)
pH: 7 (ref 5.0–8.0)

## 2016-03-27 LAB — CBC WITH DIFFERENTIAL/PLATELET
BASOS PCT: 0 %
Basophils Absolute: 0 10*3/uL (ref 0.0–0.1)
EOS ABS: 0.1 10*3/uL (ref 0.0–0.7)
Eosinophils Relative: 1 %
HCT: 38.4 % — ABNORMAL LOW (ref 39.0–52.0)
Hemoglobin: 13.6 g/dL (ref 13.0–17.0)
LYMPHS ABS: 1.1 10*3/uL (ref 0.7–4.0)
Lymphocytes Relative: 28 %
MCH: 32 pg (ref 26.0–34.0)
MCHC: 35.4 g/dL (ref 30.0–36.0)
MCV: 90.4 fL (ref 78.0–100.0)
Monocytes Absolute: 0.4 10*3/uL (ref 0.1–1.0)
Monocytes Relative: 9 %
NEUTROS PCT: 62 %
Neutro Abs: 2.5 10*3/uL (ref 1.7–7.7)
PLATELETS: 200 10*3/uL (ref 150–400)
RBC: 4.25 MIL/uL (ref 4.22–5.81)
RDW: 12.7 % (ref 11.5–15.5)
WBC: 4 10*3/uL (ref 4.0–10.5)

## 2016-03-27 LAB — LIPASE, BLOOD: Lipase: 22 U/L (ref 11–51)

## 2016-03-27 LAB — OCCULT BLOOD X 1 CARD TO LAB, STOOL: FECAL OCCULT BLD: NEGATIVE

## 2016-03-27 MED ORDER — POLYETHYLENE GLYCOL 3350 17 GM/SCOOP PO POWD: 17.0000 g | Freq: Every day | ORAL | 0 refills | Status: AC

## 2016-03-27 MED FILL — POLYETHYLENE GLYCOL 3350 PO: 31 days supply | Qty: 527 | Fill #0

## 2016-03-27 NOTE — ED Notes (Signed)
ED Provider at bedside. 

## 2016-03-27 NOTE — Discharge Instructions (Signed)
You intermittent abdominal pain and blood in the stool may be related to constipation. Please eat a high fiber diet. If you feel constipated, you can also take over the counter laxative, like Miralax or polyethylene glycol. You dissolve a capful of tasteless powder into any drink or soft food and take once daily until stools softened.  Please return without fail for worsening symptoms, including fever, intractable vomiting, worsening pain, worsening bleeding, or any other symptoms concerning to you.  If you have further bleeding, you can also follow-up with gastroenterology (listed above).

## 2016-03-27 NOTE — ED Provider Notes (Signed)
[facility]   CSN: [id number] Arrival date & time: 03/27/16  1211     History   Chief Complaint Chief Complaint  Patient presents with  . Abdominal Pain    HPI Oscar Clarke is a 26 y.o. male.  HPI 26 year old male who presents with intermittent abdominal pain. He has a history of Asperger syndrome and nephrolithiasis. Presents with two complaints.   States intermittent low back pain for several months now after lifting heavy furniture. Taking OTC medications and prescribed tramadol for this with good effect. Occasionally pain does come back when he twist his back a certain way, worse with movement. Severity of 4/10 maximum usually. No bowel or urinary incontinence, urinary retention. No numbness or weakness, fever or chills. No fall or trauma.   States intermittent abdominal cramping since December. Has noticed today that he had bright red blood per rectum after having bowel movement that stained toilet paper. No blood clots. No nausea, vomiting. Thinks his stool may be on the harder side. No dysuria, urinary frequency, hematuria, fever or chills. No NSAID usage.    Past Medical History:  Diagnosis Date  . Asperger syndrome   . Kidney stone     There are no active problems to display for this patient.   History reviewed. No pertinent surgical history.     Home Medications    Prior to Admission medications   Medication Sig Start Date End Date Taking? Authorizing Provider  IBUPROFEN IB PO Take by mouth.    Historical Provider, MD  ondansetron (ZOFRAN) 4 MG tablet Take 1 tablet (4 mg total) by mouth every 6 (six) hours. 12/27/11   Jerelyn ScottMartha Linker, MD  oxyCODONE-acetaminophen (PERCOCET/ROXICET) 5-325 MG per tablet Take 1-2 tablets by mouth every 6 (six) hours as needed for pain. 12/27/11   Jerelyn ScottMartha Linker, MD  polyethylene glycol powder (MIRALAX) powder Take 17 g by mouth daily. 03/27/16   Lavera Guiseana Duo Maryn Freelove, MD  terazosin (HYTRIN) 1 MG capsule Take 1 capsule  (1 mg total) by mouth at bedtime. Stop taking this medication after passage of stone. 07/12/11 07/11/12  Linward Headlandyan K Brown, MD    Family History Non-contributory.   Social History Social History  Substance Use Topics  . Smoking status: Never Smoker  . Smokeless tobacco: Never Used  . Alcohol use Yes     Comment: occ     Allergies   Patient has no known allergies.   Review of Systems Review of Systems 10/14 systems reviewed and are negative other than those stated in the HPI   Physical Exam Updated Vital Signs BP 132/85 (BP Location: Left Arm)   Pulse 79   Temp 97.4 F (36.3 C) (Oral)   Resp 16   Ht 5\' 8"  (1.727 m)   Wt 143 lb (64.9 kg)   SpO2 100%   BMI 21.74 kg/m   Physical Exam Physical Exam  Nursing note and vitals reviewed. Constitutional: Well developed, well nourished, non-toxic, and in no acute distress Head: Normocephalic and atraumatic.  Mouth/Throat: Oropharynx is clear and moist.  Neck: Normal range of motion. Neck supple.  Cardiovascular: Normal rate and regular rhythm.   Pulmonary/Chest: Effort normal and breath sounds normal.  Abdominal: Soft. There is no tenderness. There is no rebound and no guarding.  Musculoskeletal: Normal range of motion. no back tenderness. Neurological: Alert, no facial droop, fluent speech, moves all extremities symmetrically Skin: Skin is warm and dry.  Psychiatric: Cooperative   ED Treatments / Results  Labs (all  labs ordered are listed, but only abnormal results are displayed) Labs Reviewed  CBC WITH DIFFERENTIAL/PLATELET - Abnormal; Notable for the following:       Result Value   HCT 38.4 (*)    All other components within normal limits  COMPREHENSIVE METABOLIC PANEL - Abnormal; Notable for the following:    Total Bilirubin 1.3 (*)    All other components within normal limits  LIPASE, BLOOD  URINALYSIS, ROUTINE W REFLEX MICROSCOPIC  OCCULT BLOOD X 1 CARD TO LAB, STOOL  POC OCCULT BLOOD, ED    EKG  EKG  Interpretation None       Radiology Dg Abd Acute W/chest  Result Date: 03/27/2016 CLINICAL DATA:  Chronic abdominal pain with nausea EXAM: DG ABDOMEN ACUTE W/ 1V CHEST COMPARISON:  CT abdomen pelvis December 27, 2011 FINDINGS: PA chest: Lungs are clear. Heart size and pulmonary vascularity are normal. No adenopathy. There is upper thoracic dextroscoliosis. Supine and upright abdomen: There is moderate stool throughout colon. There is no bowel dilatation or air-fluid level suggesting bowel obstruction. No free air. No abnormal calcifications. IMPRESSION: Bowel gas pattern unremarkable. No bowel obstruction or free air. Moderate stool in colon. Lungs clear. Electronically Signed   By: Bretta Bang III M.D.   On: 03/27/2016 13:55    Procedures Procedures (including critical care time)  Medications Ordered in ED Medications - No data to display   Initial Impression / Assessment and Plan / ED Course  I have reviewed the triage vital signs and the nursing notes.  Pertinent labs & imaging results that were available during my care of the patient were reviewed by me and considered in my medical decision making (see chart for details).    Presents with back pain and abdominal pain, intermittent. Currently no back pain on exam and no significant abdominal pain. Back pain seem consistent with back strain. No fall/trauma to need x-ray, and no red flag symptoms to warrant advanced imaging. Abdomen pain also intermittent. Taking PPI for presumed indigestion. Question constipation given BRBPR and harder stools. XR abd visualized and shows moderate stool but no obstructive pattern or free air. Blood work and UA reassuring. Occult blood negative and no active bleeding on rectal exam. Suspect potential mild hemorrhoidal bleeding.   Will start miralax for potential constipation with hemorrhoidal bleeding. Strict return and follow-up instructions reviewed. He expressed understanding of all discharge  instructions and felt comfortable with the plan of care.   Final Clinical Impressions(s) / ED Diagnoses   Final diagnoses:  Abdominal pain  Generalized abdominal pain    New Prescriptions New Prescriptions   POLYETHYLENE GLYCOL POWDER (MIRALAX) POWDER    Take 17 g by mouth daily.     Lavera Guise, MD 03/27/16 [phone]

## 2016-03-27 NOTE — ED Triage Notes (Signed)
C/o abd/lower back pain x 2-c/o blood in stools x 1 today-NAD-steady gait

## 2016-03-27 NOTE — ED Notes (Signed)
Patient transported to X-ray 

## 2016-03-29 ENCOUNTER — Encounter (HOSPITAL_BASED_OUTPATIENT_CLINIC_OR_DEPARTMENT_OTHER): Payer: Self-pay | Admitting: *Deleted

## 2016-03-29 ENCOUNTER — Emergency Department (HOSPITAL_BASED_OUTPATIENT_CLINIC_OR_DEPARTMENT_OTHER)
Admission: EM | Admit: 2016-03-29 | Discharge: 2016-03-29 | Disposition: A | Discharge status: Home / Self Care | Payer: 59 | Attending: Emergency Medicine | Admitting: Emergency Medicine

## 2016-03-29 DIAGNOSIS — R195 Other fecal abnormalities: Secondary | ICD-10-CM | POA: Insufficient documentation

## 2016-03-29 DIAGNOSIS — Z79899 Other long term (current) drug therapy: Secondary | ICD-10-CM | POA: Insufficient documentation

## 2016-03-29 DIAGNOSIS — R1033 Periumbilical pain: Secondary | ICD-10-CM | POA: Diagnosis present

## 2016-03-29 LAB — OCCULT BLOOD X 1 CARD TO LAB, STOOL: Fecal Occult Bld: NEGATIVE

## 2016-03-29 NOTE — ED Notes (Signed)
Pt states he took miralax for constipation and had a large bowel movement this morning. Pt was concerned about how it looked and became anxious so came in for evaluation.

## 2016-03-29 NOTE — ED Notes (Signed)
Pt c/o mild ABD pain and also rectal bleeding. Seen here for same x2 days ago

## 2016-03-29 NOTE — ED Provider Notes (Signed)
[facility]   CSN: [id number] Arrival date & time: 03/29/16  0903     History   Chief Complaint Chief Complaint  Patient presents with  . Abdominal Pain    HPI Oscar Clarke is a 26 y.o. male with past medical history of Asperger syndrome and kidney stone who presents to the emergency department concerned about the stool he passed this morning. Patient states that he had one small, painless, nonbloody, darker than usual bowel movement this morning that "scared me". Patient denies seeing bright red blood in the toilet bowl or on the paper after wiping. Patient reports dull, intermittent periumbilical abdominal which he was seen for in the ED two days ago for "quite some time".  Patient states he was discharged yesterday and advised to start consuming more fiber and water, and start taking miralax.  Pt states he has been taking miralax x 2 days and has been eating a lot of raisins (had big bowl of raisins yesterday).  Patient currently denies abdominal pain.  Patient denies fevers, nausea, vomiting, urinary symptoms, h/o NSAIDs use, ETOH abuse, h/o PUD.    HPI  Past Medical History:  Diagnosis Date  . Asperger syndrome   . Kidney stone     There are no active problems to display for this patient.   History reviewed. No pertinent surgical history.     Home Medications    Prior to Admission medications   Medication Sig Start Date End Date Taking? Authorizing Provider  polyethylene glycol powder (MIRALAX) powder Take 17 g by mouth daily. 03/27/16  Yes Lavera Guiseana Duo Liu, MD  IBUPROFEN IB PO Take by mouth.    Historical Provider, MD  ondansetron (ZOFRAN) 4 MG tablet Take 1 tablet (4 mg total) by mouth every 6 (six) hours. 12/27/11   Jerelyn ScottMartha Linker, MD  oxyCODONE-acetaminophen (PERCOCET/ROXICET) 5-325 MG per tablet Take 1-2 tablets by mouth every 6 (six) hours as needed for pain. 12/27/11   Jerelyn ScottMartha Linker, MD  terazosin (HYTRIN) 1 MG capsule Take 1 capsule (1 mg  total) by mouth at bedtime. Stop taking this medication after passage of stone. 07/12/11 07/11/12  Linward Headlandyan K Brown, MD    Family History No family history on file.  Social History Social History  Substance Use Topics  . Smoking status: Never Smoker  . Smokeless tobacco: Never Used  . Alcohol use Yes     Comment: occ     Allergies   Patient has no known allergies.   Review of Systems Review of Systems  Constitutional: Negative for chills and fever.  HENT: Negative for congestion and sore throat.   Eyes: Negative for visual disturbance.  Respiratory: Negative for cough and shortness of breath.   Cardiovascular: Negative for chest pain and palpitations.  Gastrointestinal: Negative for abdominal pain, blood in stool, constipation, diarrhea, nausea and vomiting.  Genitourinary: Negative for difficulty urinating, flank pain and hematuria.  Musculoskeletal: Negative for joint swelling and myalgias.  Skin: Negative for rash.  Neurological: Negative for dizziness, syncope, weakness, light-headedness and headaches.  Hematological: Negative.   Psychiatric/Behavioral: Negative.      Physical Exam Updated Vital Signs BP 138/70 (BP Location: Right Arm)   Pulse 68   Temp 97.8 F (36.6 C) (Oral)   Resp 16   Ht 5\' 8"  (1.727 m)   Wt 64.9 kg   SpO2 100%   BMI 21.74 kg/m   Physical Exam  Constitutional: He is oriented to person, place, and time. He appears well-developed and  well-nourished. No distress.  NAD. Good historian.  HENT:  Head: Normocephalic and atraumatic.  Nose: Nose normal.  Mouth/Throat: Oropharynx is clear and moist. No oropharyngeal exudate.  Moist mucous membranes.  No nasal mucosa edema.  Oropharynx and tonsils pink without erythema, edema, exudates or lesions.  Uvula midline. No trismus.   Eyes: Conjunctivae and EOM are normal. Pupils are equal, round, and reactive to light.  Neck: Normal range of motion. Neck supple. No JVD present. No tracheal deviation  present.  Cardiovascular: Normal rate, regular rhythm, normal heart sounds and intact distal pulses.   No murmur heard. Pulmonary/Chest: Effort normal and breath sounds normal. No respiratory distress. He has no wheezes. He has no rales.  Abdominal: Soft. Bowel sounds are normal. He exhibits no distension. There is no tenderness.  No surgical abdominal scars noted. No pulsating masses.  + Bowel sounds throughout.  Abdomen is soft, non tender without distention, rigidity, guarding or rebound.  No suprapubic tenderness. No CVAT.  Negative Murphy's. Negative McBurney's. Negative Psoas sign.  Non palpable kidneys. No hepatosplenomegaly.   Genitourinary:  Genitourinary Comments: Chaperone was present. Patient without tenderness in perianal/rectal area with palpation. There are no external fissures or external hemorrhoids noted. No induration or swelling of the perianal skin.  Patient able to tolerate examination. I was able to feel the first 2-3cm of the rectum digitally without gross abnormalities or masses. Stool color is brown with no gross blood noted.  No signs of perirectal abscess.  DRE reveals good sphincter tone.    Musculoskeletal: Normal range of motion. He exhibits no deformity.  Lymphadenopathy:    He has no cervical adenopathy.  Neurological: He is alert and oriented to person, place, and time.  Skin: Skin is warm and dry. Capillary refill takes less than 2 seconds.  Psychiatric: He has a normal mood and affect. His behavior is normal. Judgment and thought content normal.  Nursing note and vitals reviewed.    ED Treatments / Results  Labs (all labs ordered are listed, but only abnormal results are displayed) Labs Reviewed  OCCULT BLOOD X 1 CARD TO LAB, STOOL    EKG  EKG Interpretation None       Radiology Dg Abd Acute W/chest  Result Date: 03/27/2016 CLINICAL DATA:  Chronic abdominal pain with nausea EXAM: DG ABDOMEN ACUTE W/ 1V CHEST COMPARISON:  CT abdomen pelvis  December 27, 2011 FINDINGS: PA chest: Lungs are clear. Heart size and pulmonary vascularity are normal. No adenopathy. There is upper thoracic dextroscoliosis. Supine and upright abdomen: There is moderate stool throughout colon. There is no bowel dilatation or air-fluid level suggesting bowel obstruction. No free air. No abnormal calcifications. IMPRESSION: Bowel gas pattern unremarkable. No bowel obstruction or free air. Moderate stool in colon. Lungs clear. Electronically Signed   By: Bretta Bang III M.D.   On: 03/27/2016 13:55    Procedures Procedures (including critical care time)  Medications Ordered in ED Medications - No data to display   Initial Impression / Assessment and Plan / ED Course  I have reviewed the triage vital signs and the nursing notes.  Pertinent labs & imaging results that were available during my care of the patient were reviewed by me and considered in my medical decision making (see chart for details).     26 year old male with past medical history of Asperger's syndrome and kidney stone presents to the emergency department concerned about a bowel movement he had this morning. Patient states that he had a nonbloody, painless,  slightly loose dark brown stool this morning that he considers different from his normal stools. Patient became concerned as he was recently discharged from the emergency department 2 days ago with MiraLAX. Per chart review patient presented to the ED on 03/27/2016 reporting intermittent abdominal cramping. EDPA at that time question constipation given reported BRBPR and harder stools, moderate stool in abdominal x-ray and reassuring blood work and urinalysis with negative Hemoccult. Patient was discharged with MiraLAX for constipation and hemorrhoidal bleeding. Patient states that he started taking MiraLAX, started eating more fruits and vegetables, had a one large bulla every since yesterday. On exam patient denies abdominal pain, abdominal  exam is completely normal. The visual rectal exam normal without external hemorrhoids, fissures, stool is brown without obvious bleeding. Hemoccult negative today. Vital signs within normal limits. I provided a lot of reassurance to patient and explained that his bowel movement this morning is most likely due to increased fiber intake and MiraLAX. I doubt diverticulitis as patient has no abdominal pain today. No obvious bleeding on digital rectal exam. VS wnl today.  No additional ED lab work or iamging indicated at this time, pt had normal work up two days ago in ED. Patient will be discharged at this time, encouraged to make appointment with primary care office for routine medical care. Patient advised to continue taking MiraLAX, fiber, push fluids to prevent constipation. Patient verbalized understanding, states he will call primary care office to an appointment and follow up with her gastroenterologist.  Final Clinical Impressions(s) / ED Diagnoses   Final diagnoses:  Change in consistency of stool    New Prescriptions Discharge Medication List as of 03/29/2016 11:58 AM       Liberty Handy, PA-C 03/29/16 1209    Alvira Monday, MD 04/01/16 1349

## 2016-03-29 NOTE — ED Notes (Signed)
Patient is on the Autism Spectrum. Pt is high functioning, however is extremely concerned about the characteristics of his stool. Bowel sounds X4, no tenderness or guarding. Pt is AOx4

## 2016-03-29 NOTE — ED Triage Notes (Signed)
Pt seen here 2 days ago for constipation. Started on miralx. Had a BM today which "scared him". Pt appears anxious. Cooperative with care

## 2016-03-29 NOTE — Discharge Instructions (Signed)
There was no blood in your stool today.   Your change in stool was probably due to miralax, increase in fiber, vegetables and raisins.    Please establish care with a primary care provider for routine medical care.   Follow up with a gastroenterologist as recommended by emergency department provider two days ago.

## 2018-09-04 IMAGING — CR DG ABDOMEN ACUTE W/ 1V CHEST
3 series · 3 of 3 positions shown · non-contrast
Comparison: CT abdomen pelvis December 27, 2011

CLINICAL DATA: Chronic abdominal pain with nausea

EXAM:
DG ABDOMEN ACUTE W/ 1V CHEST

[w chest pa]
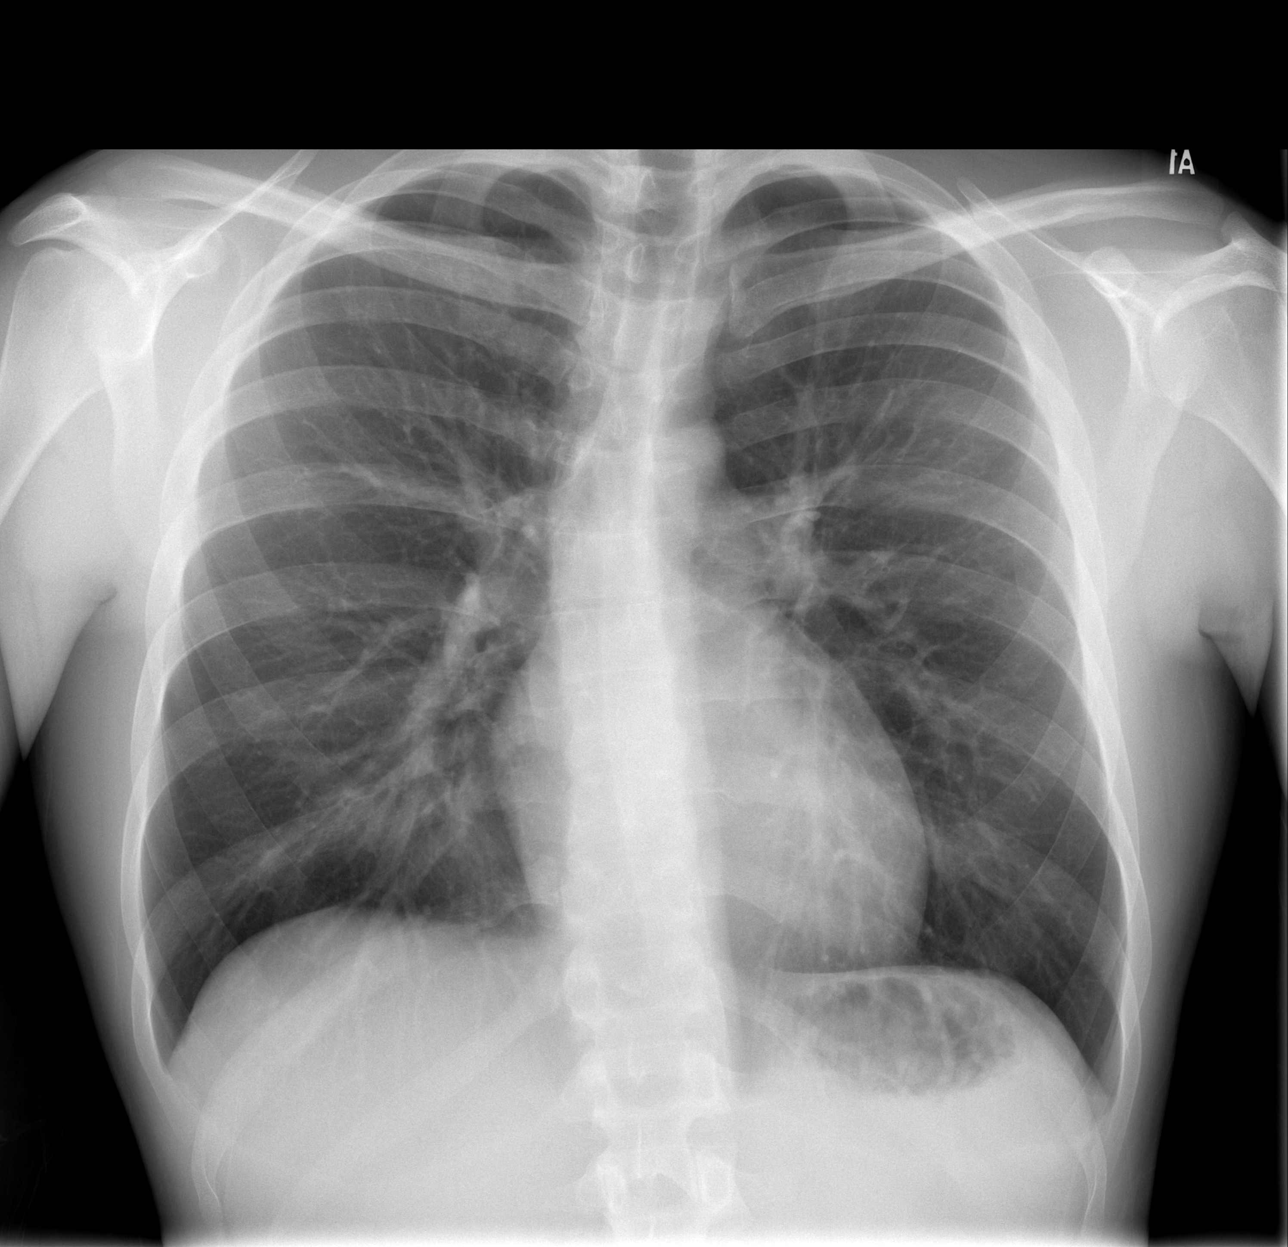

[t abdomen supine]
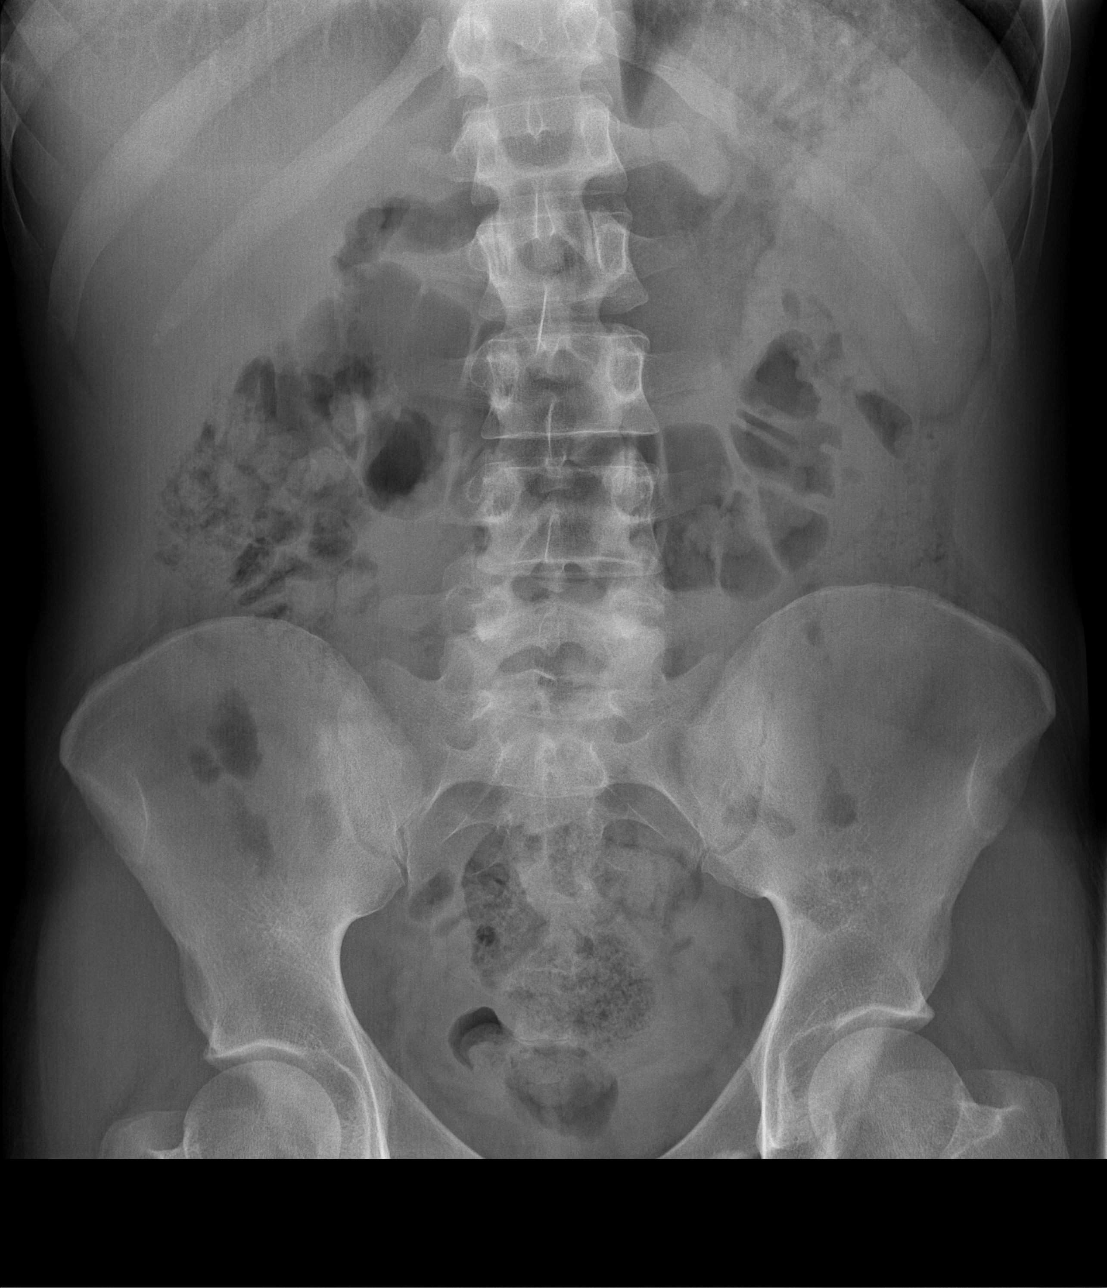

[w abdomen upright]
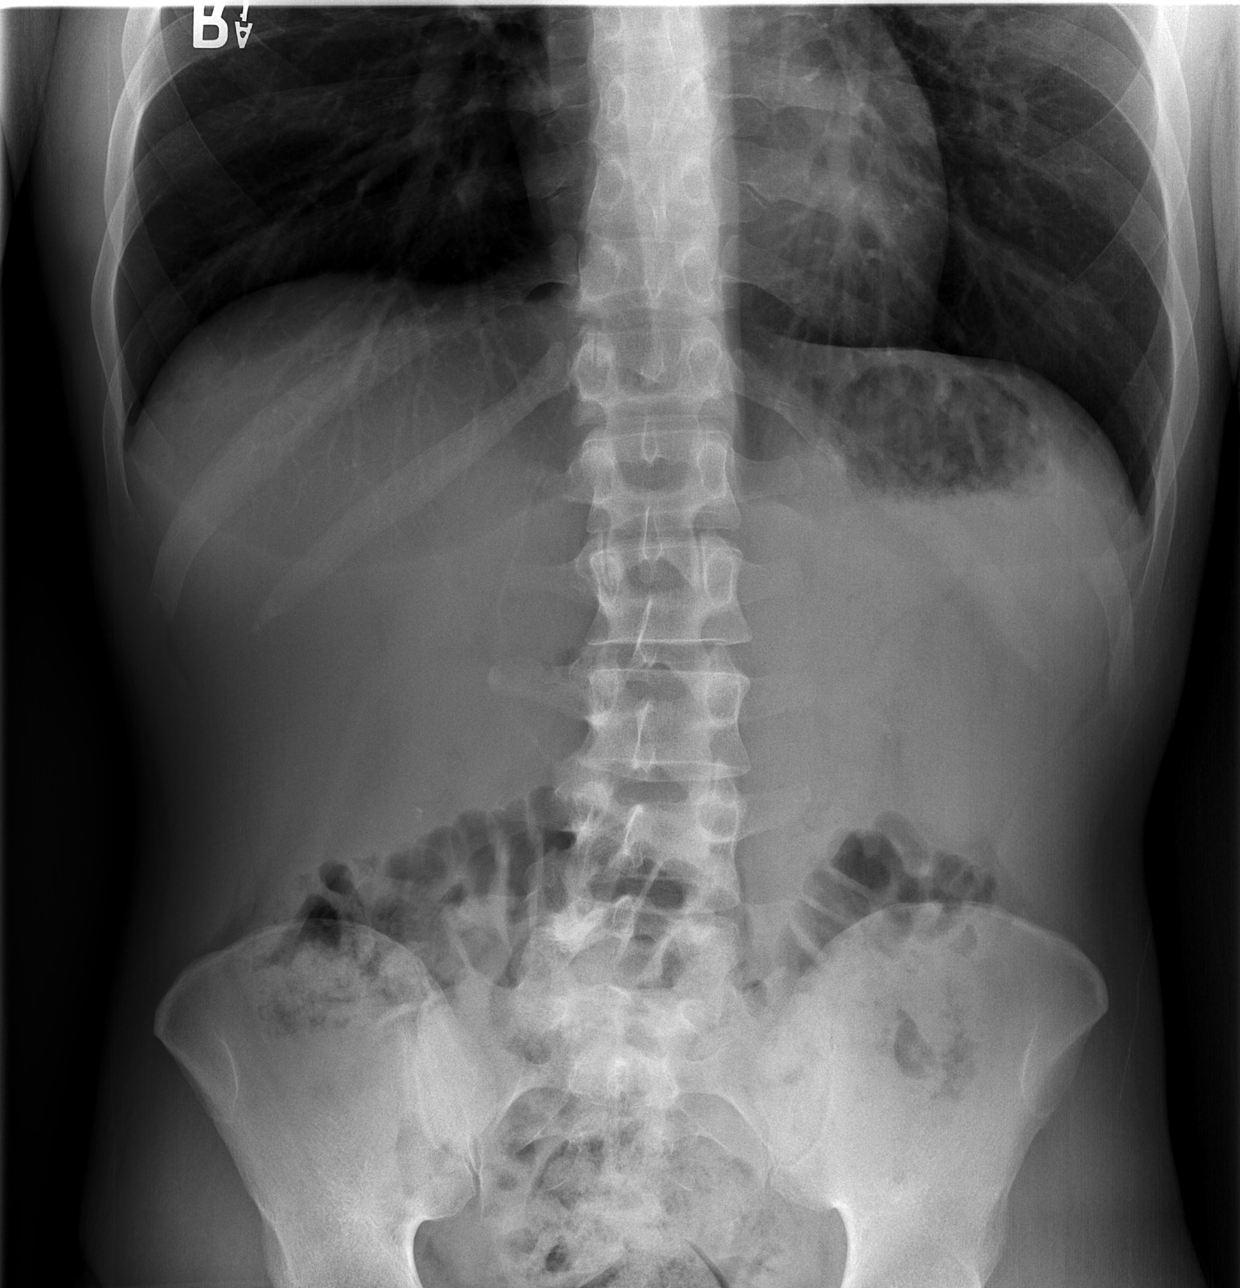

[3 of 3 positions shown; findings below may reference images not displayed]

FINDINGS: PA chest: Lungs are clear. Heart size and pulmonary vascularity are
normal. No adenopathy. There is upper thoracic dextroscoliosis.

Supine and upright abdomen: There is moderate stool throughout
colon. There is no bowel dilatation or air-fluid level suggesting
bowel obstruction. No free air. No abnormal calcifications.
IMPRESSION: Bowel gas pattern unremarkable. No bowel obstruction or free air.
Moderate stool in colon. Lungs clear.

## 2021-06-20 ENCOUNTER — Other Ambulatory Visit: Payer: Self-pay

## 2021-06-20 ENCOUNTER — Encounter (HOSPITAL_BASED_OUTPATIENT_CLINIC_OR_DEPARTMENT_OTHER): Payer: Self-pay | Admitting: Urology

## 2021-06-20 ENCOUNTER — Emergency Department (HOSPITAL_BASED_OUTPATIENT_CLINIC_OR_DEPARTMENT_OTHER)
Admission: EM | Admit: 2021-06-20 | Discharge: 2021-06-20 | Disposition: A | Discharge status: Home / Self Care | Payer: Medicaid Other | Attending: Emergency Medicine | Admitting: Emergency Medicine

## 2021-06-20 DIAGNOSIS — Z09 Encounter for follow-up examination after completed treatment for conditions other than malignant neoplasm: Secondary | ICD-10-CM | POA: Insufficient documentation

## 2021-06-20 LAB — URINALYSIS, ROUTINE W REFLEX MICROSCOPIC
Bilirubin Urine: NEGATIVE
Glucose, UA: NEGATIVE mg/dL
Ketones, ur: NEGATIVE mg/dL
Leukocytes,Ua: NEGATIVE
Nitrite: NEGATIVE
Protein, ur: NEGATIVE mg/dL
Specific Gravity, Urine: <=1.005 (ref 1.005–1.030)
pH: 6 (ref 5.0–8.0)

## 2021-06-20 LAB — URINALYSIS, MICROSCOPIC (REFLEX)

## 2021-06-20 NOTE — Discharge Instructions (Signed)
As we discussed, your work-up in the ER today was reassuring for acute abnormalities.  I recommend ensuring that you are maintaining adequate oral hydration.  Follow-up with your primary care doctor for your continued health needs. ? ?Return if development of any new or worsening symptoms. ?

## 2021-06-20 NOTE — ED Triage Notes (Signed)
Pt states he feels like he has kidney stones over a while now.  Denies any pain or discomfort at this time, states been drinking water and denies any urinary symptoms at this time, denies hematuria  ?States " I just want to be sure I am doing everything right" ?

## 2021-06-20 NOTE — ED Provider Notes (Signed)
?[facility] EMERGENCY DEPARTMENT ?Provider Note ? ? ?CSN: [id number] ?Arrival date & time: 06/20/21  1406 ? ?  ? ?History ? ?Chief Complaint  ?Patient presents with  ? Follow-up  ? ? ?Oscar Clarke is a 31 y.o. male. ? ?Patient with history of Asperger syndrome presents today for follow-up. He states that many years ago he was told that he had kidney stones, states that he was told to drink plenty of water and avoid salty and sugary food which he has been doing. He is unable to tell me who gave him this diagnosis or information. States that he has not had any pain or urinary symptoms since then, just presented today 'to make sure Im doing everything right.' He states that he is currently completely asymptomatic without flank pain, abdominal pain, nausea, vomiting, diarrhea, dysuria, or hematuria. ? ?The history is provided by the patient. No language interpreter was used.  ? ?  ? ?Home Medications ?Prior to Admission medications   ?Medication Sig Start Date End Date Taking? Authorizing Provider  ?IBUPROFEN IB PO Take by mouth.    [provider]  ?ondansetron (ZOFRAN) 4 MG tablet Take 1 tablet (4 mg total) by mouth every 6 (six) hours. 12/27/11   Mabe, Latanya Maudlin, MD  ?oxyCODONE-acetaminophen (PERCOCET/ROXICET) 5-325 MG per tablet Take 1-2 tablets by mouth every 6 (six) hours as needed for pain. 12/27/11   Mabe, Latanya Maudlin, MD  ?polyethylene glycol powder (MIRALAX) powder Take 17 g by mouth daily. 03/27/16   Lavera Guise, MD  ?terazosin (HYTRIN) 1 MG capsule Take 1 capsule (1 mg total) by mouth at bedtime. Stop taking this medication after passage of stone. 07/12/11 07/11/12  Linward Headland, MD  ?   ? ?Allergies    ?Patient has no known allergies.   ? ?Review of Systems   ?Review of Systems  ?All other systems reviewed and are negative. ? ?Physical Exam ?Updated Vital Signs ?BP 139/88 (BP Location: Right Arm)   Pulse 71   Temp 98 ?F (36.7 ?C) (Oral)   Resp 18   Ht 5\' 8"  (1.727 m)   Wt 64.9 kg   SpO2  100%   BMI 21.76 kg/m?  ?Physical Exam ?Vitals and nursing note reviewed.  ?Constitutional:   ?   General: He is not in acute distress. ?   Appearance: Normal appearance. He is normal weight. He is not ill-appearing, toxic-appearing or diaphoretic.  ?HENT:  ?   Head: Normocephalic and atraumatic.  ?Cardiovascular:  ?   Rate and Rhythm: Normal rate.  ?Pulmonary:  ?   Effort: Pulmonary effort is normal. No respiratory distress.  ?Abdominal:  ?   General: Abdomen is flat.  ?   Palpations: Abdomen is soft.  ?   Tenderness: There is no abdominal tenderness. There is no right CVA tenderness or left CVA tenderness.  ?Musculoskeletal:     ?   General: Normal range of motion.  ?   Cervical back: Normal range of motion.  ?Skin: ?   General: Skin is warm and dry.  ?Neurological:  ?   General: No focal deficit present.  ?   Mental Status: He is alert.  ?Psychiatric:     ?   Mood and Affect: Mood normal.     ?   Behavior: Behavior normal.  ? ? ?ED Results / Procedures / Treatments   ?Labs ?(all labs ordered are listed, but only abnormal results are displayed) ?Labs Reviewed  ?URINALYSIS, ROUTINE W REFLEX MICROSCOPIC -  Abnormal; Notable for the following components:  ?    Result Value  ? Hgb urine dipstick SMALL (*)   ? All other components within normal limits  ?URINALYSIS, MICROSCOPIC (REFLEX) - Abnormal; Notable for the following components:  ? Bacteria, UA RARE (*)   ? All other components within normal limits  ? ? ?EKG ?None ? ?Radiology ?No results found. ? ?Procedures ?Procedures  ? ? ?Medications Ordered in ED ?Medications - No data to display ? ?ED Course/ Medical Decision Making/ A&P ?  ?                        ?Medical Decision Making ?Amount and/or Complexity of Data Reviewed ?Labs: ordered. ? ? ?Patient presents today requesting follow-up from kidney stone diagnosis 'many years ago.' He is afebrile, non-toxic appearing, and in no acute distress with reassuring vital signs and benign physical exam. Some hematuria  noted on UA, however patient denies any pain or other symptoms at this time, therefore no further work-up indicated at this time. Upon further investigation, patient states that his primary motivation was to get a work note as 'I just didn't feel like going in today.' Stable for discharge at this time. Patient is understanding and amenable with plan. Educated on red flag symptoms that would prompt immediate return. Discharged in stable condition. ? ? ?Final Clinical Impression(s) / ED Diagnoses ?Final diagnoses:  ?Follow-up exam  ? ? ?Rx / DC Orders ?ED Discharge Orders   ? ? None  ? ?  ?An After Visit Summary was printed and given to the patient. ? ? ?  ?Silva Bandy, PA-C ?06/20/21 1649 ? ?  ?Arby Barrette, MD ?06/25/21 1259 ? ?
